# Patient Record
Sex: Female | Born: 1975
Health system: Southern US, Community
[De-identification: ages and names within clinical notes are randomized; demographics above are authoritative.]

## PROBLEM LIST (undated history)

## (undated) DIAGNOSIS — Z794 Long term (current) use of insulin: Secondary | ICD-10-CM

## (undated) DIAGNOSIS — E119 Type 2 diabetes mellitus without complications: Secondary | ICD-10-CM

## (undated) DIAGNOSIS — N200 Calculus of kidney: Secondary | ICD-10-CM

## (undated) DIAGNOSIS — D649 Anemia, unspecified: Secondary | ICD-10-CM

## (undated) DIAGNOSIS — R87610 Atypical squamous cells of undetermined significance on cytologic smear of cervix (ASC-US): Secondary | ICD-10-CM

## (undated) DIAGNOSIS — N809 Endometriosis, unspecified: Secondary | ICD-10-CM

## (undated) DIAGNOSIS — Z9889 Other specified postprocedural states: Secondary | ICD-10-CM

## (undated) HISTORY — DX: Long term (current) use of insulin: Z79.4

## (undated) HISTORY — PX: NASAL SINUS SURGERY: SHX719

## (undated) HISTORY — DX: Calculus of kidney: N20.0

## (undated) HISTORY — PX: BREAST ENHANCEMENT SURGERY: SHX7

## (undated) HISTORY — PX: APPENDECTOMY: SHX54

## (undated) HISTORY — PX: OVARIAN CYST REMOVAL: SHX89

## (undated) HISTORY — PX: OTHER SURGICAL HISTORY: SHX169

## (undated) HISTORY — DX: Atypical squamous cells of undetermined significance on cytologic smear of cervix (ASC-US): R87.610

## (undated) HISTORY — DX: Type 2 diabetes mellitus without complications: E11.9

## (undated) HISTORY — DX: Endometriosis, unspecified: N80.9

## (undated) HISTORY — PX: ABDOMINAL HYSTERECTOMY: SHX81

## (undated) HISTORY — PX: TUBAL LIGATION: SHX77

## (undated) HISTORY — PX: EYE SURGERY: SHX253

## (undated) HISTORY — PX: FOOT SURGERY: SHX648

---

## 1998-11-02 ENCOUNTER — Emergency Department (HOSPITAL_COMMUNITY): Admission: EM | Admit: 1998-11-02 | Discharge: 1998-11-02 | Payer: Self-pay | Admitting: Emergency Medicine

## 1998-11-02 ENCOUNTER — Encounter: Payer: Self-pay | Admitting: Emergency Medicine

## 1998-11-11 ENCOUNTER — Other Ambulatory Visit: Admission: RE | Admit: 1998-11-11 | Discharge: 1998-11-11 | Payer: Self-pay | Admitting: *Deleted

## 2000-01-06 ENCOUNTER — Other Ambulatory Visit: Admission: RE | Admit: 2000-01-06 | Discharge: 2000-01-06 | Payer: Self-pay | Admitting: Obstetrics & Gynecology

## 2000-08-07 ENCOUNTER — Inpatient Hospital Stay (HOSPITAL_COMMUNITY): Admission: AD | Admit: 2000-08-07 | Discharge: 2000-08-07 | Payer: Self-pay | Admitting: *Deleted

## 2000-08-25 ENCOUNTER — Inpatient Hospital Stay (HOSPITAL_COMMUNITY): Admission: AD | Admit: 2000-08-25 | Discharge: 2000-08-25 | Payer: Self-pay | Admitting: Obstetrics and Gynecology

## 2000-08-26 ENCOUNTER — Inpatient Hospital Stay (HOSPITAL_COMMUNITY): Admission: AD | Admit: 2000-08-26 | Discharge: 2000-08-28 | Payer: Self-pay | Admitting: Obstetrics and Gynecology

## 2000-11-29 ENCOUNTER — Other Ambulatory Visit: Admission: RE | Admit: 2000-11-29 | Discharge: 2000-11-29 | Payer: Self-pay | Admitting: Obstetrics and Gynecology

## 2001-12-06 ENCOUNTER — Other Ambulatory Visit: Admission: RE | Admit: 2001-12-06 | Discharge: 2001-12-06 | Payer: Self-pay | Admitting: Obstetrics and Gynecology

## 2002-10-16 ENCOUNTER — Other Ambulatory Visit: Admission: RE | Admit: 2002-10-16 | Discharge: 2002-10-16 | Payer: Self-pay | Admitting: Obstetrics and Gynecology

## 2007-10-07 ENCOUNTER — Emergency Department (HOSPITAL_COMMUNITY): Admission: EM | Admit: 2007-10-07 | Discharge: 2007-10-07 | Payer: Self-pay | Admitting: Emergency Medicine

## 2008-04-14 ENCOUNTER — Ambulatory Visit (HOSPITAL_COMMUNITY): Admission: RE | Admit: 2008-04-14 | Discharge: 2008-04-14 | Payer: Self-pay | Admitting: Family Medicine

## 2008-04-14 ENCOUNTER — Encounter (INDEPENDENT_AMBULATORY_CARE_PROVIDER_SITE_OTHER): Payer: Self-pay | Admitting: Family Medicine

## 2009-04-28 ENCOUNTER — Ambulatory Visit: Payer: Self-pay | Admitting: Family Medicine

## 2009-04-28 DIAGNOSIS — Z87442 Personal history of urinary calculi: Secondary | ICD-10-CM | POA: Insufficient documentation

## 2009-04-28 DIAGNOSIS — R319 Hematuria, unspecified: Secondary | ICD-10-CM | POA: Insufficient documentation

## 2009-04-28 LAB — CONVERTED CEMR LAB
Bilirubin Urine: NEGATIVE
Glucose, Urine, Semiquant: NEGATIVE
Ketones, urine, test strip: NEGATIVE
Nitrite: NEGATIVE
Protein, U semiquant: NEGATIVE
Specific Gravity, Urine: 1.01
Urobilinogen, UA: 0.2
pH: 6.5

## 2009-05-01 ENCOUNTER — Encounter: Payer: Self-pay | Admitting: Internal Medicine

## 2009-07-27 ENCOUNTER — Ambulatory Visit: Payer: Self-pay | Admitting: Occupational Medicine

## 2009-07-27 ENCOUNTER — Encounter (INDEPENDENT_AMBULATORY_CARE_PROVIDER_SITE_OTHER): Payer: Self-pay | Admitting: *Deleted

## 2010-07-12 NOTE — Letter (Signed)
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  Please take this into consideration when reviewing the time away from work/school.    Special Instructions:  [  ] None  [ x ] To be off the remainder of today, returning to the normal work / school schedule tomorrow.  [  ] To be off until the next scheduled appointment on ______________________.  [  ] Other ________________________________________________________________ ________________________________________________________________________   Sincerely yours,   Shelbie Proctor

## 2010-07-12 NOTE — Assessment & Plan Note (Signed)
Summary: FEVER,COUGH,RUNNY NOSE/-yellowish x 2 dys rm 1   Vital Signs:  Patient Profile:   35 Years Old Female CC:      Cold & URI symptoms Height:     64 inches Weight:      152 pounds O2 Sat:      100 % O2 treatment:    Room Air Temp:     99.0 degrees F oral Pulse rate:   74 / minute Pulse rhythm:   regular Resp:     16 per minute BP sitting:   115 / 76  (right arm) Cuff size:   regular  Vitals Entered By: Areta Haber CMA (July 27, 2009 5:12 PM)                  Current Allergies: ! BIAXIN   History of Present Illness Chief Complaint: Cold & URI symptoms History of Present Illness: Presents with complaints of sinus congestion, and yellow nasal drainage.  No reports of fever.   Headache as well.   No reports of cough.  No diarrhea.   No sore throat or ear pain.   Symptoms present 3 days.   History of sinus surgery in 2009 for chronic sinusitis.   Doing well since then.   Current Problems: UPPER RESPIRATORY INFECTION, ACUTE (ICD-465.9) RENAL CALCULUS, HX OF (ICD-V13.01) HEMATURIA UNSPECIFIED (ICD-599.70)   Current Meds MUCINEX D 60-600 MG XR12H-TAB (PSEUDOEPHEDRINE-GUAIFENESIN) As directed ADVIL 200 MG TABS (IBUPROFEN) as directed AZITHROMYCIN 250 MG  TABS (AZITHROMYCIN) 2 by  mouth today and then 1 daily for 4 days  REVIEW OF SYSTEMS Constitutional Symptoms       Complains of fever.     Denies chills, night sweats, weight loss, weight gain, and fatigue.  Eyes       Denies change in vision, eye pain, eye discharge, glasses, contact lenses, and eye surgery. Ear/Nose/Throat/Mouth       Complains of frequent runny nose and sinus problems.      Denies hearing loss/aids, change in hearing, ear pain, ear discharge, dizziness, frequent nose bleeds, sore throat, hoarseness, and tooth pain or bleeding.      Comments: yellowish x 2 dys Respiratory       Complains of dry cough.      Denies productive cough, wheezing, shortness of breath, asthma, bronchitis, and  emphysema/COPD.  Cardiovascular       Denies murmurs, chest pain, and tires easily with exhertion.    Gastrointestinal       Complains of stomach pain and diarrhea.      Denies nausea/vomiting, constipation, blood in bowel movements, and indigestion.      Comments: x today Genitourniary       Denies painful urination, kidney stones, and loss of urinary control. Neurological       Complains of headaches.      Denies paralysis, seizures, and fainting/blackouts. Musculoskeletal       Denies muscle pain, joint pain, joint stiffness, decreased range of motion, redness, swelling, muscle weakness, and gout.  Skin       Denies bruising, unusual mles/lumps or sores, and hair/skin or nail changes.  Psych       Denies mood changes, temper/anger issues, anxiety/stress, speech problems, depression, and sleep problems. Other Comments: Pt has not been seen by PCP   Past History:  Past Medical History: Last updated: 04/28/2009 Unremarkable  Past Surgical History: Last updated: 04/28/2009 Appendectomy Tubal ligation Foot Surgery Sinus Surgery Ovarian cyst  Family History: Last updated: 04/28/2009 Pancreatic Cancer  Family Hsitory Headaches  Social History: Last updated: 04/28/2009 Married Never Smoked Alcohol use-no Drug use-no Regular exercise-yes  Risk Factors: Exercise: yes (04/28/2009)  Risk Factors: Smoking Status: never (04/28/2009) Physical Exam General appearance: well developed, well nourished, no acute distress Pupils: equal, round, reactive to light Ears: normal, no lesions or deformities Nasal: marked sinus and nasal congestion Oral/Pharynx: tongue normal, posterior pharynx without erythema or exudate Chest/Lungs: no rales, wheezes, or rhonchi bilateral, breath sounds equal without effort Heart: regular rate and  rhythm, no murmur Assessment New Problems: UPPER RESPIRATORY INFECTION, ACUTE (ICD-465.9)   Plan New Medications/Changes: AZITHROMYCIN 250 MG   TABS (AZITHROMYCIN) 2 by  mouth today and then 1 daily for 4 days  #6 x 0, 07/27/2009, Kathrine Haddock MD  New Orders: Est. Patient Level II [id number] Planning Comments:   2.Over the counter cough and cold prepartaions such as (mucinex or Mucinex-DM) may be used to treat your cold symptoms.  Afrin nasal spray (2 sprays each nostril 2 times a day for three days) may be used to relieve sinus congestion.  Do not get your z-pack filled until Friday.   Only get this filled if your symptoms worsen or you are no better by Friday.     The patient and/or caregiver has been counseled thoroughly with regard to medications prescribed including dosage, schedule, interactions, rationale for use, and possible side effects and they verbalize understanding.  Diagnoses and expected course of recovery discussed and will return if not improved as expected or if the condition worsens. Patient and/or caregiver verbalized understanding.  Prescriptions: AZITHROMYCIN 250 MG  TABS (AZITHROMYCIN) 2 by  mouth today and then 1 daily for 4 days  #6 x 0   Entered and Authorized by:   Kathrine Haddock MD   Signed by:   Kathrine Haddock MD on 07/27/2009   Method used:   Print then Give to Patient   RxID:   [id number]

## 2013-01-07 ENCOUNTER — Emergency Department
Admission: EM | Admit: 2013-01-07 | Discharge: 2013-01-07 | Disposition: A | Discharge status: Home / Self Care | Payer: Self-pay | Source: Home / Self Care | Attending: Family Medicine | Admitting: Family Medicine

## 2013-01-07 ENCOUNTER — Encounter: Payer: Self-pay | Admitting: *Deleted

## 2013-01-07 DIAGNOSIS — M2669 Other specified disorders of temporomandibular joint: Secondary | ICD-10-CM

## 2013-01-07 DIAGNOSIS — M26629 Arthralgia of temporomandibular joint, unspecified side: Secondary | ICD-10-CM

## 2013-01-07 MED ORDER — CYCLOBENZAPRINE HCL 10 MG PO TABS: ORAL_TABLET | ORAL | Status: DC

## 2013-01-07 MED ORDER — MELOXICAM 15 MG PO TABS: 15.0000 mg | ORAL_TABLET | Freq: Every day | ORAL | Status: DC

## 2013-01-07 NOTE — ED Notes (Signed)
Pt c/o RT ear pain x 1wk. Denies fever. She has taken Mucinex and IBF.

## 2013-01-07 NOTE — ED Provider Notes (Signed)
CSN: [id number]     Arrival date & time 01/07/13  1828 History     First MD Initiated Contact with Patient 01/07/13 1843     Chief Complaint  Patient presents with  . Otalgia     HPI Comments: Patient complains of pain in her right ear for one week.  No recent URI symptoms.  She feels that her hearing is slightly decreased.  She has a history of allergies and notes some post-nasal drainage.  She has had occasional night sweats recently.  No discharge from ear.  No improvement with Mucinex D. She admits that she has had problem grinding her teeth in past and has night guards.  The history is provided by the patient.    History reviewed. No pertinent past medical history. Past Surgical History  Procedure Laterality Date  . Foot surgery    . Nasal sinus surgery    . Eye surgery    . Breast enhancement surgery    . Abdominal hysterectomy    . Appendectomy     Family History  Problem Relation Age of Onset  . Cancer Mother     lung/ brain tumor removed  . Cancer Father     pancreatic   History  Substance Use Topics  . Smoking status: Never Smoker   . Smokeless tobacco: Not on file  . Alcohol Use: Yes   OB History   Grav Para Term Preterm Abortions TAB SAB Ect Mult Living                 Review of Systems No sore throat No cough No pleuritic pain No wheezing No nasal congestion + post-nasal drainage No sinus pain/pressure No itchy/red eyes + earache No hemoptysis No SOB No fever/chills.  Has had night sweats No nausea No vomiting No abdominal pain No diarrhea No urinary symptoms No skin rashes No fatigue No myalgias + headache Used OTC meds without relief  Allergies  Clarithromycin and Morphine and related  Home Medications   Current Outpatient Rx  Name  Route  Sig  Dispense  Refill  . cyclobenzaprine (FLEXERIL) 10 MG tablet      Take one tab by mouth at bedtime   15 tablet   0   . meloxicam (MOBIC) 15 MG tablet   Oral   Take 1 tablet (15 mg  total) by mouth daily. Take with food each evening   15 tablet   0    BP 112/71  Pulse 71  Temp(Src) 98.4 F (36.9 C) (Oral)  Resp 16  Ht 5\' 4"  (1.626 m)  Wt 146 lb (66.225 kg)  BMI 25.05 kg/m2  SpO2 100% Physical Exam Nursing notes and Vital Signs reviewed. Appearance:  Patient appears healthy, stated age, and in no acute distress Eyes:  Pupils are equal, round, and reactive to light and accomodation.  Extraocular movement is intact.  Conjunctivae are not inflamed  Ears:  Canals normal.  Tympanic membranes normal. There is distinct tenderness over the right temporomandibular joint.  Palpation there recreates her pain.   Nose:  Mildly congested turbinates.  No sinus tenderness.    Pharynx:  Normal Neck:  Supple.  No adenopathy Lungs:  Clear to auscultation.  Breath sounds are equal.  Heart:  Regular rate and rhythm without murmurs, rubs, or gallops.   Skin:  No rash present.   ED Course   Procedures  none  Labs Reviewed - Tympanogram normal both ears   1. TMJ pain dysfunction syndrome  MDM  Begin Mobic and Flexeril Continue night guard. Apply ice pack once or twice daily. Follwoup with ENT if not improving two weeks.  Lattie Haw, MD 01/13/13 [phone]

## 2013-07-28 ENCOUNTER — Encounter: Payer: Self-pay | Admitting: Emergency Medicine

## 2013-07-28 ENCOUNTER — Emergency Department
Admission: EM | Admit: 2013-07-28 | Discharge: 2013-07-28 | Disposition: A | Discharge status: Home / Self Care | Payer: BC Managed Care – PPO | Source: Home / Self Care | Attending: Emergency Medicine | Admitting: Emergency Medicine

## 2013-07-28 DIAGNOSIS — J0100 Acute maxillary sinusitis, unspecified: Secondary | ICD-10-CM

## 2013-07-28 MED ORDER — CEFDINIR 300 MG PO CAPS: 300.0000 mg | ORAL_CAPSULE | Freq: Two times a day (BID) | ORAL | Status: DC

## 2013-07-28 MED ORDER — CEFTRIAXONE SODIUM 1 G IJ SOLR
1.0000 g | INTRAMUSCULAR | Status: AC
  Administered 2013-07-28: 1 g via INTRAMUSCULAR

## 2013-07-28 MED ORDER — FLUTICASONE PROPIONATE 50 MCG/ACT NA SUSP: NASAL | Status: DC

## 2013-07-28 NOTE — ED Notes (Signed)
Ellen Morales c/o sinus pain and congestion with green mucous x 2 weeks. No fever.

## 2013-07-28 NOTE — ED Provider Notes (Signed)
CSN: [id number]     Arrival date & time 07/28/13  1312 History   First MD Initiated Contact with Patient 07/28/13 1317     Chief Complaint  Patient presents with  . Sinus Problem     (Consider location/radiation/quality/duration/timing/severity/associated sxs/prior Treatment) HPI URI HISTORY  Ellen Morales is a 38 y.o. female who complains of onset of cold/sinus symptoms for 2 weeks.  Have been using over-the-counter treatment, such as Mucinex D and ibuprofen, which helps a little bit. In the past she's had recurrent sinus infections. However this is her first sinus infection since her sinus surgery 3 years ago.  No chills/sweats +  Fever  +  Nasal congestion +  Discolored Post-nasal drainage No bilateral maxillary sinus pain/pressure, which is severe, and she can feel it in her teeth No sore throat  No  cough No wheezing No chest congestion No hemoptysis No shortness of breath No pleuritic pain  No itchy/red eyes Mild, right earache  No nausea No vomiting No abdominal pain No diarrhea  No skin rashes +  Fatigue No myalgias No headache  History reviewed. No pertinent past medical history. Past Surgical History  Procedure Laterality Date  . Foot surgery    . Nasal sinus surgery    . Eye surgery    . Breast enhancement surgery    . Abdominal hysterectomy    . Appendectomy     Family History  Problem Relation Age of Onset  . Cancer Mother     lung/ brain tumor removed  . Diabetes Mother   . Cancer Father     pancreatic   History  Substance Use Topics  . Smoking status: Never Smoker   . Smokeless tobacco: Never Used  . Alcohol Use: Yes   OB History   Grav Para Term Preterm Abortions TAB SAB Ect Mult Living                 Review of Systems  All other systems reviewed and are negative.      Allergies  Clarithromycin and Morphine and related  Home Medications   Current Outpatient Rx  Name  Route  Sig  Dispense  Refill  . cefdinir (OMNICEF) 300  MG capsule   Oral   Take 1 capsule (300 mg total) by mouth 2 (two) times daily. X 14 days   28 capsule   0   . fluticasone (FLONASE) 50 MCG/ACT nasal spray      1 or 2 sprays each nostril twice a day   16 g   0    BP 115/78  Pulse 76  Temp(Src) 98.1 F (36.7 C) (Oral)  Resp 16  Wt 148 lb (67.132 kg)  SpO2 100% Physical Exam  Nursing note and vitals reviewed. Constitutional: She is oriented to person, place, and time. She appears well-developed and well-nourished. No distress.  HENT:  Head: Normocephalic and atraumatic.  Right Ear: Tympanic membrane, external ear and ear canal normal.  Left Ear: Tympanic membrane, external ear and ear canal normal.  Nose: Mucosal edema and rhinorrhea present. Right sinus exhibits maxillary sinus tenderness. Left sinus exhibits maxillary sinus tenderness.  Mouth/Throat: Oropharynx is clear and moist. No oral lesions. No oropharyngeal exudate.  Eyes: Right eye exhibits no discharge. Left eye exhibits no discharge. No scleral icterus.  Neck: Neck supple.  Cardiovascular: Normal rate, regular rhythm and normal heart sounds.   Pulmonary/Chest: Effort normal and breath sounds normal. She has no wheezes. She has no rales.  Lymphadenopathy:  She has no cervical adenopathy.  Neurological: She is alert and oriented to person, place, and time.  Skin: Skin is warm and dry.    ED Course  Procedures (including critical care time) Labs Review Labs Reviewed - No data to display Imaging Review No results found.    MDM   Final diagnoses:  Acute maxillary sinusitis    Treatment options discussed. At her request to start treatment with injection of antibiotic, Rocephin 1 g IM stat given.--Given her complex history of recurrent sinusitis, and the severity of her current symptoms, I'm agreeable that this is clinically indicated. Omnicef 300 mg by mouth twice a day x14 days Flonase Continue Mucinex D and ibuprofen Other symptomatic care  discussed Precautions discussed. Red flags discussed. Questions invited and answered. Patient voiced understanding and agreement.      Lajean Manesavid Massey, MD 07/28/13 [phone]

## 2013-09-10 DIAGNOSIS — R87610 Atypical squamous cells of undetermined significance on cytologic smear of cervix (ASC-US): Secondary | ICD-10-CM

## 2013-09-10 HISTORY — DX: Atypical squamous cells of undetermined significance on cytologic smear of cervix (ASC-US): R87.610

## 2013-11-20 ENCOUNTER — Emergency Department
Admission: EM | Admit: 2013-11-20 | Discharge: 2013-11-20 | Disposition: A | Discharge status: Home / Self Care | Payer: BC Managed Care – PPO | Source: Home / Self Care | Attending: Emergency Medicine | Admitting: Emergency Medicine

## 2013-11-20 DIAGNOSIS — R300 Dysuria: Secondary | ICD-10-CM

## 2013-11-20 DIAGNOSIS — N390 Urinary tract infection, site not specified: Secondary | ICD-10-CM

## 2013-11-20 LAB — POCT URINALYSIS DIP (MANUAL ENTRY)
Bilirubin, UA: NEGATIVE
Blood, UA: NEGATIVE
Glucose, UA: NEGATIVE
Ketones, POC UA: NEGATIVE
Leukocytes, UA: NEGATIVE
Nitrite, UA: POSITIVE
Protein Ur, POC: NEGATIVE
Spec Grav, UA: >=1.03 (ref 1.005–1.03)
Urobilinogen, UA: 0.2 (ref 0–1)
pH, UA: 5 (ref 5–8)

## 2013-11-20 MED ORDER — CIPROFLOXACIN HCL 500 MG PO TABS
500.0000 mg | ORAL_TABLET | Freq: Two times a day (BID) | ORAL | Status: DC
Start: 2013-11-20 — End: 2014-08-28

## 2013-11-20 NOTE — ED Provider Notes (Signed)
CSN: [id number]     Arrival date & time 11/20/13  1309 History   First MD Initiated Contact with Patient 11/20/13 1315     Chief Complaint  Patient presents with  . Dysuria   (Consider location/radiation/quality/duration/timing/severity/associated sxs/prior Treatment) HPI This is a 38 y.o. female who presents today with UTI symptoms for 1 day.  + dysuria + frequency + urgency No hematuria No vaginal discharge No fever/chills + Minimal suprapubic abdominal pain Minimal nausea this morning, resolved now  No vomiting Mild low back pain No fatigue She denies chance of pregnancy.--Status post hysterectomy Has tried over-the-counter measures without improvement.   History of kidney stone 2014, but the pain is not similar to what she had then. History of UTIs in the past with otherwise negative urology workup. Denies UTI in the past 12 months. No past medical history on file. Past Surgical History  Procedure Laterality Date  . Foot surgery    . Nasal sinus surgery    . Eye surgery    . Breast enhancement surgery    . Abdominal hysterectomy    . Appendectomy     Family History  Problem Relation Age of Onset  . Cancer Mother     lung/ brain tumor removed  . Diabetes Mother   . Cancer Father     pancreatic   History  Substance Use Topics  . Smoking status: Never Smoker   . Smokeless tobacco: Never Used  . Alcohol Use: Yes   OB History   Grav Para Term Preterm Abortions TAB SAB Ect Mult Living                 Review of Systems  All other systems reviewed and are negative.   Allergies  Clarithromycin and Morphine and related  Home Medications   Prior to Admission medications   Medication Sig Start Date End Date Taking? Authorizing Provider  fexofenadine (ALLEGRA) 180 MG tablet Take 180 mg by mouth daily.   Yes Historical Provider, MD  ibuprofen (ADVIL,MOTRIN) 800 MG tablet Take 800 mg by mouth every 8 (eight) hours as needed.   Yes Historical Provider, MD   pseudoephedrine-guaifenesin (MUCINEX D) 60-600 MG per tablet Take 1 tablet by mouth every 12 (twelve) hours.   Yes Historical Provider, MD  cefdinir (OMNICEF) 300 MG capsule Take 1 capsule (300 mg total) by mouth 2 (two) times daily. X 14 days 07/28/13   Lajean Manes, MD  ciprofloxacin (CIPRO) 500 MG tablet Take 1 tablet (500 mg total) by mouth 2 (two) times daily. Take for 10 days. 11/20/13   Lajean Manes, MD  fluticasone [facility]) 50 MCG/ACT nasal spray 1 or 2 sprays each nostril twice a day 07/28/13   Lajean Manes, MD   BP 117/69  Pulse 76  Temp(Src) 98.4 F (36.9 C) (Oral)  Ht 5\' 4"  (1.626 m)  Wt 156 lb (70.761 kg)  BMI 26.76 kg/m2  SpO2 99% Physical Exam  Nursing note and vitals reviewed. Constitutional: She is oriented to person, place, and time. She appears well-developed and well-nourished. No distress.  HENT:  Mouth/Throat: Oropharynx is clear and moist.  Eyes: No scleral icterus.  Neck: Neck supple.  Cardiovascular: Normal rate, regular rhythm and normal heart sounds.   Pulmonary/Chest: Breath sounds normal.  Abdominal: Soft. She exhibits no mass. There is no hepatosplenomegaly. There is tenderness in the suprapubic area. There is no rebound, no guarding and no CVA tenderness.  Lymphadenopathy:    She has no cervical adenopathy.  Neurological: She is  alert and oriented to person, place, and time.  Skin: Skin is warm and dry.    ED Course  Procedures (including critical care time) Labs Review Labs Reviewed  URINE CULTURE  POCT URINALYSIS DIP (MANUAL ENTRY)   Results for orders placed during the hospital encounter of 11/20/13  POCT URINALYSIS DIP (MANUAL ENTRY)      Result Value Ref Range   Color, UA orange     Clarity, UA clear     Glucose, UA neg     Bilirubin, UA negative     Bilirubin, UA negative     Spec Grav, UA >=1.030  1.005 - 1.03   Blood, UA negative     pH, UA 5.0  5 - 8   Protein Ur, POC negative     Urobilinogen, UA 0.2  0 - 1   Nitrite, UA  Positive     Leukocytes, UA Negative      Imaging Review No results found.   MDM   1. UTI (urinary tract infection)   2. Dysuria    Urinalysis positive for nitrate, but negative for blood. Clinically, has uncomplicated cystitis Without evidence of pyelonephritis or kidney stone.  Treatment options discussed. Cipro 500 twice a day x10 days Other symptomatic care discussed Send off urine culture Followup with PCP or urologist if not improving in 3-4 days, sooner if worse or if any red flags . She voiced understanding and agreement.     Lajean Manesavid Massey, MD 11/20/13 1440

## 2013-11-20 NOTE — ED Notes (Signed)
LBP, dysuria started yesterday

## 2013-11-22 ENCOUNTER — Telehealth: Payer: Self-pay | Admitting: *Deleted

## 2013-11-22 LAB — URINE CULTURE
Colony Count: NO GROWTH
Organism ID, Bacteria: NO GROWTH

## 2014-08-28 ENCOUNTER — Emergency Department
Admission: EM | Admit: 2014-08-28 | Discharge: 2014-08-28 | Disposition: A | Discharge status: Home / Self Care | Payer: BLUE CROSS/BLUE SHIELD | Source: Home / Self Care | Attending: Emergency Medicine | Admitting: Emergency Medicine

## 2014-08-28 ENCOUNTER — Encounter: Payer: Self-pay | Admitting: Emergency Medicine

## 2014-08-28 DIAGNOSIS — J209 Acute bronchitis, unspecified: Secondary | ICD-10-CM | POA: Diagnosis not present

## 2014-08-28 MED ORDER — BENZONATATE 200 MG PO CAPS: ORAL_CAPSULE | ORAL | Status: DC

## 2014-08-28 MED ORDER — AZITHROMYCIN 250 MG PO TABS: ORAL_TABLET | ORAL | Status: DC

## 2014-08-28 MED ORDER — PREDNISONE (PAK) 10 MG PO TABS: ORAL_TABLET | ORAL | Status: DC

## 2014-08-28 MED ORDER — PROMETHAZINE-CODEINE 6.25-10 MG/5ML PO SYRP: ORAL_SOLUTION | ORAL | Status: DC

## 2014-08-28 MED ORDER — METHYLPREDNISOLONE ACETATE 80 MG/ML IJ SUSP
80.0000 mg | Freq: Once | INTRAMUSCULAR | Status: AC
  Administered 2014-08-28: 80 mg via INTRAMUSCULAR

## 2014-08-28 NOTE — ED Notes (Signed)
Cough, hoarseness, congestion x 10 days

## 2014-08-28 NOTE — ED Provider Notes (Signed)
CSN: [id number]     Arrival date & time 08/28/14  0915 History   First MD Initiated Contact with Patient 08/28/14 [phone]     Chief Complaint  Patient presents with  . Cough    HPI URI HISTORY  Kayzlee is a 39 y.o. female who complains of onset of respiratory, chest congestion, cold symptoms for 10 days.--Progressively worsening.   She is under the care of Dr. Lovett Calender at [facility]. Started with sinus infection with discolored rhinorrhea about 14 days ago, completed course of Ceftin and prednisone Dosepak. Sinus symptoms improved, but for the past 10 days, progressively worsening chest congestion, hacking cough that keeps her up at night, irritating burning cough throughout her chest when coughing, and now cough is productive of green sputum the past 2 days. No definite shortness of breath. No exertional chest pain. Tried Delsym OTC but that didn't help the cough. She has chronic seasonal allergies, getting allergy shots. Denies history of asthma or chronic lung disease. No history of pneumonia. Reviewed her drug allergies. She has had GI side effects on Biaxin in the past, but has taken Zithromax in the past without problems.  No chills/sweats +  Low-grade Fever  + Minimal Nasal congestion No Discolored Post-nasal drainage No sinus pain/pressure Minimal sore throat  +  Packing, productive cough No wheezing Positive chest congestion No hemoptysis No shortness of breath No pleuritic pain  No itchy/red eyes No earache  No nausea No vomiting No abdominal pain No diarrhea  No skin rashes +  Fatigue No myalgias No headache   History reviewed. No pertinent past medical history. Past Surgical History  Procedure Laterality Date  . Foot surgery    . Nasal sinus surgery    . Eye surgery    . Breast enhancement surgery    . Abdominal hysterectomy    . Appendectomy     Family History  Problem Relation Age of Onset  . Cancer Mother     lung/ brain tumor removed  . Diabetes  Mother   . Cancer Father     pancreatic   History  Substance Use Topics  . Smoking status: Never Smoker   . Smokeless tobacco: Never Used  . Alcohol Use: Yes   OB History    No data available     Review of Systems  All other systems reviewed and are negative.   Allergies  Clarithromycin and Morphine and related  Home Medications   Prior to Admission medications   Medication Sig Start Date End Date Taking? Authorizing Provider  cetirizine (ZYRTEC) 10 MG tablet Take 10 mg by mouth daily.   Yes Historical Provider, MD  azithromycin (ZITHROMAX Z-PAK) 250 MG tablet Take 2 tablets on day one, then 1 tablet daily on days 2 through 5 08/28/14   Lajean Manes, MD  benzonatate (TESSALON) 200 MG capsule Take 1 every 8 hours as needed for cough. 08/28/14   Lajean Manes, MD  fexofenadine (ALLEGRA) 180 MG tablet Take 180 mg by mouth daily.    Historical Provider, MD  fluticasone Aleda Grana) 50 MCG/ACT nasal spray 1 or 2 sprays each nostril twice a day 07/28/13   Lajean Manes, MD  ibuprofen (ADVIL,MOTRIN) 800 MG tablet Take 800 mg by mouth every 8 (eight) hours as needed.    Historical Provider, MD  predniSONE (STERAPRED UNI-PAK) 10 MG tablet Starting Saturday 3/19, Take as directed for 6 days.--Take 6 on day 1, 5 on day 2, 4 on day 3, then 3 tablets on day 4,  then 2 tablets on day 5, then 1 on day 6. 08/28/14   Lajean Manesavid Massey, MD  promethazine-codeine [facility](PHENERGAN WITH CODEINE) 6.25-10 MG/5ML syrup Take 1-2 teaspoons every 4 hrs at night as needed for cough. May cause drowsiness. 08/28/14   Lajean Manesavid Massey, MD  pseudoephedrine-guaifenesin [facility](MUCINEX D) 60-600 MG per tablet Take 1 tablet by mouth every 12 (twelve) hours.    Historical Provider, MD   BP 118/73 mmHg  Pulse 64  Temp(Src) 98.2 F (36.8 C) (Oral)  Ht 5\' 4"  (1.626 m)  Wt 156 lb (70.761 kg)  BMI 26.76 kg/m2  SpO2 100% Physical Exam  Constitutional: She is oriented to person, place, and time. She appears well-developed and well-nourished. No  distress.  HENT:  Head: Normocephalic and atraumatic.  Right Ear: Tympanic membrane normal.  Left Ear: Tympanic membrane normal.  Nose: Nose normal.  Mouth/Throat: Oropharynx is clear and moist. No oropharyngeal exudate.  Eyes: Right eye exhibits no discharge. Left eye exhibits no discharge. No scleral icterus.  Neck: Neck supple.  Cardiovascular: Normal rate, regular rhythm and normal heart sounds.   Pulmonary/Chest: No respiratory distress. She has rhonchi. She has no rales.  No wheezes, except rare late expiratory wheeze anteriorly on forced expiration only.  Hacking cough noted  Lymphadenopathy:    She has no cervical adenopathy.  Neurological: She is alert and oriented to person, place, and time.  Skin: Skin is warm and dry. No rash noted.  Psychiatric: She has a normal mood and affect.  Nursing note and vitals reviewed.   ED Course  Procedures (including critical care time) Labs Review Labs Reviewed - No data to display  Imaging Review No results found.   MDM   1. Acute bronchitis, unspecified organism    likely from both infection and inflammatory components. No clinical evidence of pneumonia. She declined chest x-ray today.  Treatment options discussed, as well as risks, benefits, alternatives. Patient voiced understanding and agreement with the following plans: Depo-Medrol 80 mg IM. New Prescriptions   AZITHROMYCIN (ZITHROMAX Z-PAK) 250 MG TABLET    Take 2 tablets on day one, then 1 tablet daily on days 2 through 5   BENZONATATE (TESSALON) 200 MG CAPSULE    Take 1 every 8 hours as needed for cough.   PREDNISONE (STERAPRED UNI-PAK) 10 MG TABLET    Starting Saturday 3/19, Take as directed for 6 days.--Take 6 on day 1, 5 on day 2, 4 on day 3, then 3 tablets on day 4, then 2 tablets on day 5, then 1 on day 6.   PROMETHAZINE-CODEINE (PHENERGAN WITH CODEINE) 6.25-10 MG/5ML SYRUP    Take 1-2 teaspoons every 4 hrs at night as needed for cough. May cause drowsiness.   (note: History of intolerance to morphine after surgery in the past, but she has taken codeine in the past without side effects or problems) Follow-up with your ENT or PCP in 5-7 days if not improving, or sooner if symptoms become worse. Precautions discussed. Red flags discussed. Questions invited and answered. Patient voiced understanding and agreement.     Lajean Manesavid Massey, MD 08/28/14 1005

## 2014-09-16 ENCOUNTER — Encounter: Payer: Self-pay | Admitting: Emergency Medicine

## 2014-09-16 ENCOUNTER — Emergency Department (INDEPENDENT_AMBULATORY_CARE_PROVIDER_SITE_OTHER): Payer: BLUE CROSS/BLUE SHIELD

## 2014-09-16 ENCOUNTER — Emergency Department
Admission: EM | Admit: 2014-09-16 | Discharge: 2014-09-16 | Disposition: A | Discharge status: Home / Self Care | Payer: BLUE CROSS/BLUE SHIELD | Source: Home / Self Care | Attending: Family Medicine | Admitting: Family Medicine

## 2014-09-16 DIAGNOSIS — R079 Chest pain, unspecified: Secondary | ICD-10-CM | POA: Diagnosis not present

## 2014-09-16 DIAGNOSIS — R05 Cough: Secondary | ICD-10-CM

## 2014-09-16 DIAGNOSIS — K219 Gastro-esophageal reflux disease without esophagitis: Secondary | ICD-10-CM | POA: Diagnosis not present

## 2014-09-16 DIAGNOSIS — R053 Chronic cough: Secondary | ICD-10-CM

## 2014-09-16 MED ORDER — BENZONATATE 200 MG PO CAPS: ORAL_CAPSULE | ORAL | Status: DC

## 2014-09-16 MED ORDER — OMEPRAZOLE 40 MG PO CPDR: 40.0000 mg | DELAYED_RELEASE_CAPSULE | Freq: Every day | ORAL | Status: DC

## 2014-09-16 NOTE — ED Provider Notes (Signed)
CSN: [id number]     Arrival date & time 09/16/14  1755 History   First MD Initiated Contact with Patient 09/16/14 1804     Chief Complaint  Patient presents with  . Cough      HPI Comments: Patient developed a respiratory infection about one month ago, and after about 10 days she sought medical help.  She was started on a 10 day course of Augmentin and oral steroid.  She has a history of chronic seasonal rhinitis, followed by ENT Dr. Lovett CalenderSheely.  No history of asthma.  Her symptoms persisted after treatment with Augmentin, and she was then treated with a 10 day course of cefdinir.  Her cough persisted, and finally she was treated with a Z-pack and prednisone dosepak.  About 1.5 weeks ago she had a CT scan of her sinuses which was negative.   She reports that she feels well except for her cough, which is worse early in the morning.  Over the past several weeks she has developed increased tightness in her chest and indigestion/burning.  She has taken Tums with slight improvement, and then switched to Pepcid which was more helpful.  She states that she has had night sweats during the past month but no fever. She reports that she has a past history of minimal pericardial effusion of unknown etiology, and is followed with an echocardiogram about every 5 years.   The history is provided by the patient.    History reviewed. No pertinent past medical history. Past Surgical History  Procedure Laterality Date  . Foot surgery    . Nasal sinus surgery    . Eye surgery    . Breast enhancement surgery    . Abdominal hysterectomy    . Appendectomy     Family History  Problem Relation Age of Onset  . Cancer Mother     lung/ brain tumor removed  . Diabetes Mother   . Cancer Father     pancreatic   History  Substance Use Topics  . Smoking status: Never Smoker   . Smokeless tobacco: Never Used  . Alcohol Use: Yes   OB History    No data available     Review of Systems No sore throat + cough No  pleuritic pain, but has tightness in anterior chest + wheezing No nasal congestion No post-nasal drainage No sinus pain/pressure No itchy/red eyes No earache No hemoptysis + SOB No fever/chills No nausea No vomiting + abdominal pain and indigestion No diarrhea No urinary symptoms No skin rash No fatigue No myalgias No headache Used OTC meds without relief  Allergies  Clarithromycin and Morphine and related  Home Medications   Prior to Admission medications   Medication Sig Start Date End Date Taking? Authorizing Provider  azithromycin (ZITHROMAX Z-PAK) 250 MG tablet Take 2 tablets on day one, then 1 tablet daily on days 2 through 5 08/28/14   Lajean Manesavid Massey, MD  benzonatate (TESSALON) 200 MG capsule Take one tab by mouth at bedtime as needed for cough 09/16/14   Lattie HawStephen A Precious Segall, MD  cetirizine (ZYRTEC) 10 MG tablet Take 10 mg by mouth daily.    Historical Provider, MD  fexofenadine (ALLEGRA) 180 MG tablet Take 180 mg by mouth daily.    Historical Provider, MD  fluticasone Aleda Grana(FLONASE) 50 MCG/ACT nasal spray 1 or 2 sprays each nostril twice a day 07/28/13   Lajean Manesavid Massey, MD  ibuprofen (ADVIL,MOTRIN) 800 MG tablet Take 800 mg by mouth every 8 (eight) hours as needed.  Historical Provider, MD  omeprazole (PRILOSEC) 40 MG capsule Take 1 capsule (40 mg total) by mouth daily. Take 30 minutes before a meal. 09/16/14   Lattie Haw, MD  predniSONE (STERAPRED UNI-PAK) 10 MG tablet Starting Saturday 3/19, Take as directed for 6 days.--Take 6 on day 1, 5 on day 2, 4 on day 3, then 3 tablets on day 4, then 2 tablets on day 5, then 1 on day 6. 08/28/14   Lajean Manes, MD  promethazine-codeine [facility] WITH CODEINE) 6.25-10 MG/5ML syrup Take 1-2 teaspoons every 4 hrs at night as needed for cough. May cause drowsiness. 08/28/14   Lajean Manes, MD  pseudoephedrine-guaifenesin [facility] D) 60-600 MG per tablet Take 1 tablet by mouth every 12 (twelve) hours.    Historical Provider, MD   BP 118/80 mmHg   Pulse 75  Temp(Src) 98.2 F (36.8 C) (Oral)  Resp 16  Ht  (1.626 m)  Wt 158 lb (71.668 kg)  BMI 27.11 kg/m2  SpO2 100% Physical Exam Nursing notes and Vital Signs reviewed. Appearance:  Patient appears stated age, and in no acute distress Eyes:  Pupils are equal, round, and reactive to light and accomodation.  Extraocular movement is intact.  Conjunctivae are not inflamed  Ears:  Canals normal.  Tympanic membranes normal.  Nose:  Mildly congested turbinates.  No sinus tenderness.  Pharynx:  Normal Neck:  Supple.  No adenopathy Lungs:  Clear to auscultation.  Breath sounds are equal.  No tenderness over sternum. Heart:  Regular rate and rhythm without murmurs, rubs, or gallops.  Abdomen:  Mild sub-xiphoid tenderness without masses or hepatosplenomegaly.  Bowel sounds are present.  No CVA or flank tenderness.  Extremities:  No edema.  No calf tenderness Skin:  No rash present.   ED Course  Procedures none  Labs Reviewed -  POCT CBC:  WBC 9.5; LY 36.6; MO 5.5; GR 57.9; Hgb 12.9; Platelets 271  EKG: Rate:  72 BPM PR:  150 msec QT:  380 msec QTcH:  401 msec QRSD:  94 msec QRS axis:  46 degrees Interpretation:  Normal sinus rhythm; within normal limits.  No suggestion of pericardial effusion     Imaging Review Dg Chest 2 View  09/16/2014   CLINICAL DATA:  Cough, congestion, and chest pain.  EXAM: CHEST  2 VIEW  COMPARISON:  None.  FINDINGS: The heart size and mediastinal contours are within normal limits. Both lungs are clear. The visualized skeletal structures are unremarkable.  IMPRESSION: No active cardiopulmonary disease.   Electronically Signed   By: Gaylyn Rong M.D.   On: 09/16/2014 18:36     MDM   1. Persistent cough, probable exacerbated by development of GERD during her recent URI.  No evidence recurrent pericardial effusion.   2. Gastroesophageal reflux disease, esophagitis presence not specified    Begin Omeprazole  each evening Try Tessalon   at bedtime (if not effective may call for Tussionex) Try benzonatate  at bedtime for night cough (call if not helpful for different medication). Reflux precautions If cough persists at two weeks recommend pulmonary evaluation    Lattie Haw, MD 09/18/14 [phone]

## 2014-09-16 NOTE — Discharge Instructions (Signed)
Try benzonatate 200mg  at bedtime for night cough (call if not helpful for different medication).   Gastroesophageal Reflux Disease, Adult Gastroesophageal reflux disease (GERD) happens when acid from your stomach flows up into the esophagus. When acid comes in contact with the esophagus, the acid causes soreness (inflammation) in the esophagus. Over time, GERD may create small holes (ulcers) in the lining of the esophagus. CAUSES   Increased body weight. This puts pressure on the stomach, making acid rise from the stomach into the esophagus.  Smoking. This increases acid production in the stomach.  Drinking alcohol. This causes decreased pressure in the lower esophageal sphincter (valve or ring of muscle between the esophagus and stomach), allowing acid from the stomach into the esophagus.  Late evening meals and a full stomach. This increases pressure and acid production in the stomach.  A malformed lower esophageal sphincter. Sometimes, no cause is found. SYMPTOMS   Burning pain in the lower part of the mid-chest behind the breastbone and in the mid-stomach area. This may occur twice a week or more often.  Trouble swallowing.  Sore throat.  Dry cough.  Asthma-like symptoms including chest tightness, shortness of breath, or wheezing. DIAGNOSIS  Your caregiver may be able to diagnose GERD based on your symptoms. In some cases, X-rays and other tests may be done to check for complications or to check the condition of your stomach and esophagus. TREATMENT  Your caregiver may recommend over-the-counter or prescription medicines to help decrease acid production. Ask your caregiver before starting or adding any new medicines.  HOME CARE INSTRUCTIONS   Change the factors that you can control. Ask your caregiver for guidance concerning weight loss, quitting smoking, and alcohol consumption.  Avoid foods and drinks that make your symptoms worse, such as:  Caffeine or alcoholic  drinks.  Chocolate.  Peppermint or mint flavorings.  Garlic and onions.  Spicy foods.  Citrus fruits, such as oranges, lemons, or limes.  Tomato-based foods such as sauce, chili, salsa, and pizza.  Fried and fatty foods.  Avoid lying down for the 3 hours prior to your bedtime or prior to taking a nap.  Eat small, frequent meals instead of large meals.  Wear loose-fitting clothing. Do not wear anything tight around your waist that causes pressure on your stomach.  Raise the head of your bed 6 to 8 inches with wood blocks to help you sleep. Extra pillows will not help.  Only take over-the-counter or prescription medicines for pain, discomfort, or fever as directed by your caregiver.  Do not take aspirin, ibuprofen, or other nonsteroidal anti-inflammatory drugs (NSAIDs). SEEK IMMEDIATE MEDICAL CARE IF:   You have pain in your arms, neck, jaw, teeth, or back.  Your pain increases or changes in intensity or duration.  You develop nausea, vomiting, or sweating (diaphoresis).  You develop shortness of breath, or you faint.  Your vomit is green, yellow, black, or looks like coffee grounds or blood.  Your stool is red, bloody, or black. These symptoms could be signs of other problems, such as heart disease, gastric bleeding, or esophageal bleeding. MAKE SURE YOU:   Understand these instructions.  Will watch your condition.  Will get help right away if you are not doing well or get worse. Document Released: 03/08/2005 Document Revised: 08/21/2011 Document Reviewed: 12/16/2010 [facility] Patient Information 2015 [employer], [place]. This information is not intended to replace advice given to you by your health care provider. Make sure you discuss any questions you have with your health care  provider.

## 2014-09-16 NOTE — ED Notes (Signed)
Reports cough with some shortness of breath; started with treatment for same 08-28-14 and has had 3 courses of antibiotics; 2 courses of oral steroids; 2 steroid injections. Was clear for only 3 days. Had refused chest xray, but now wants one.

## 2014-09-24 LAB — POCT CBC W AUTO DIFF (K'VILLE URGENT CARE)

## 2014-12-11 ENCOUNTER — Other Ambulatory Visit (HOSPITAL_COMMUNITY): Payer: Self-pay | Admitting: Family Medicine

## 2014-12-11 ENCOUNTER — Ambulatory Visit (HOSPITAL_COMMUNITY)
Admission: RE | Admit: 2014-12-11 | Discharge: 2014-12-11 | Disposition: A | Discharge status: Home / Self Care | Payer: BLUE CROSS/BLUE SHIELD | Source: Ambulatory Visit | Attending: Family Medicine | Admitting: Family Medicine

## 2014-12-11 DIAGNOSIS — R059 Cough, unspecified: Secondary | ICD-10-CM

## 2014-12-11 DIAGNOSIS — R05 Cough: Secondary | ICD-10-CM | POA: Diagnosis not present

## 2014-12-11 DIAGNOSIS — R079 Chest pain, unspecified: Secondary | ICD-10-CM | POA: Diagnosis not present

## 2015-03-03 ENCOUNTER — Telehealth: Payer: Self-pay | Admitting: *Deleted

## 2015-07-11 ENCOUNTER — Encounter: Payer: Self-pay | Admitting: Emergency Medicine

## 2015-07-11 ENCOUNTER — Emergency Department
Admission: EM | Admit: 2015-07-11 | Discharge: 2015-07-11 | Disposition: A | Discharge status: Home / Self Care | Payer: BLUE CROSS/BLUE SHIELD | Source: Home / Self Care | Attending: Family Medicine | Admitting: Family Medicine

## 2015-07-11 DIAGNOSIS — G43009 Migraine without aura, not intractable, without status migrainosus: Secondary | ICD-10-CM | POA: Diagnosis not present

## 2015-07-11 MED ORDER — DEXAMETHASONE SODIUM PHOSPHATE 10 MG/ML IJ SOLN
10.0000 mg | Freq: Once | INTRAMUSCULAR | Status: AC
  Administered 2015-07-11: 10 mg via INTRAMUSCULAR

## 2015-07-11 MED ORDER — METOCLOPRAMIDE HCL 5 MG/ML IJ SOLN
10.0000 mg | Freq: Once | INTRAMUSCULAR | Status: AC
  Administered 2015-07-11: 10 mg via INTRAMUSCULAR

## 2015-07-11 MED ORDER — KETOROLAC TROMETHAMINE 60 MG/2ML IM SOLN
60.0000 mg | Freq: Once | INTRAMUSCULAR | Status: AC
  Administered 2015-07-11: 60 mg via INTRAMUSCULAR

## 2015-07-11 MED ORDER — PREDNISONE 20 MG PO TABS: ORAL_TABLET | ORAL | Status: DC

## 2015-07-11 MED ORDER — AMOXICILLIN-POT CLAVULANATE 875-125 MG PO TABS: 1.0000 | ORAL_TABLET | Freq: Two times a day (BID) | ORAL | Status: DC

## 2015-07-11 NOTE — ED Notes (Signed)
CBG results 80

## 2015-07-11 NOTE — ED Notes (Signed)
Pt c/o migraine x 3 over the weekend, unsure if it's related to sinus pressure and congestion.

## 2015-07-11 NOTE — ED Provider Notes (Signed)
CSN: [id number]     Arrival date & time 07/11/15  1416 History   First MD Initiated Contact with Patient 07/11/15 1453     Chief Complaint  Patient presents with  . Headache   (Consider location/radiation/quality/duration/timing/severity/associated sxs/prior Treatment) HPI Pt is a 40yo female presenting to [facility] with c/o 3 day hx of waxing and waning migraine headache with associated sinus congestion and facial pressure.  Pain is 8/10 at worst. She has been taking ibuprofen, mucinex, and using saline rinses w/o relief. States she has been under increased stress.  She has been seen by a neurologist in the past for migraines but has not had one for several years.  Denies fever, chills vomiting or diarrhea. Denies change in vision.   History reviewed. No pertinent past medical history. Past Surgical History  Procedure Laterality Date  . Foot surgery    . Nasal sinus surgery    . Eye surgery    . Breast enhancement surgery    . Abdominal hysterectomy    . Appendectomy     Family History  Problem Relation Age of Onset  . Cancer Mother     lung/ brain tumor removed  . Diabetes Mother   . Cancer Father     pancreatic   Social History  Substance Use Topics  . Smoking status: Never Smoker   . Smokeless tobacco: Never Used  . Alcohol Use: Yes   OB History    No data available     Review of Systems  Constitutional: Negative for fever and chills.  HENT: Positive for congestion, postnasal drip, rhinorrhea and sinus pressure. Negative for ear pain, sore throat, trouble swallowing and voice change.   Respiratory: Negative for cough and shortness of breath.   Cardiovascular: Negative for chest pain and palpitations.  Gastrointestinal: Positive for nausea. Negative for vomiting, abdominal pain and diarrhea.  Musculoskeletal: Negative for myalgias, back pain and arthralgias.  Skin: Negative for rash.  Neurological: Positive for headaches. Negative for dizziness and light-headedness.  All  other systems reviewed and are negative.   Allergies  Biaxin; Morphine and related; and Seasonal ic  Home Medications   Prior to Admission medications   Medication Sig Start Date End Date Taking? Authorizing Provider  amoxicillin-clavulanate (AUGMENTIN) 875-125 MG tablet Take 1 tablet by mouth 2 (two) times daily. One po bid x 7 days 07/11/15   Junius Finner, PA-C  ibuprofen (ADVIL,MOTRIN) 800 MG tablet Take 800 mg by mouth every 8 (eight) hours as needed.    Historical Provider, MD  predniSONE (DELTASONE) 20 MG tablet 2 tabs po daily x 3 days 07/11/15   Junius Finner, PA-C  pseudoephedrine-guaifenesin [facility] D) 60-600 MG per tablet Take 1 tablet by mouth every 12 (twelve) hours.    Historical Provider, MD   Meds Ordered and Administered this Visit   Medications  dexamethasone (DECADRON) injection 10 mg (10 mg Intramuscular Given 07/11/15 1537)  ketorolac (TORADOL) injection 60 mg (60 mg Intramuscular Given 07/11/15 1537)  metoCLOPramide (REGLAN) injection 10 mg (10 mg Intramuscular Given 07/11/15 1537)    BP 131/80 mmHg  Pulse 67  Temp(Src) 98.5 F (36.9 C) (Oral)  Ht  (1.626 m)  Wt 163 lb 8 oz (74.163 kg)  BMI 28.05 kg/m2  SpO2 100% No data found.   Physical Exam  Constitutional: She is oriented to person, place, and time. She appears well-developed and well-nourished. No distress.  HENT:  Head: Normocephalic and atraumatic.  Right Ear: Hearing, tympanic membrane, external ear and ear  canal normal.  Left Ear: Hearing, tympanic membrane, external ear and ear canal normal.  Nose: Mucosal edema present. Right sinus exhibits maxillary sinus tenderness. Right sinus exhibits no frontal sinus tenderness. Left sinus exhibits no maxillary sinus tenderness and no frontal sinus tenderness.  Mouth/Throat: Uvula is midline, oropharynx is clear and moist and mucous membranes are normal.  Eyes: Conjunctivae and EOM are normal. Pupils are equal, round, and reactive to light. No scleral  icterus.  Neck: Normal range of motion. Neck supple.  Cardiovascular: Normal rate, regular rhythm and normal heart sounds.   Pulmonary/Chest: Effort normal and breath sounds normal. No stridor. No respiratory distress. She has no wheezes. She has no rales. She exhibits no tenderness.  Abdominal: Soft. She exhibits no distension and no mass. There is no tenderness. There is no rebound and no guarding.  Musculoskeletal: Normal range of motion.  Lymphadenopathy:    She has no cervical adenopathy.  Neurological: She is alert and oriented to person, place, and time. No cranial nerve deficit. Coordination normal.  Skin: Skin is warm and dry. She is not diaphoretic.  Nursing note and vitals reviewed.   ED Course  Procedures (including critical care time)  Labs Review Labs Reviewed - No data to display  Imaging Review No results found.    MDM   1. Migraine without aura and without status migrainosus, not intractable    Pt c/o headache for 3 days. Unsure if migraine vs sinus headache.  No red flag symptoms. Doubt SAH or meningitis.   Migraine cocktail of Toradol  IM, Decadron  IM, and Reglan  IM given.  Pt states headache improved minimally but feels comfortable being discharged home.  Rx: prednisone and augmentin.  Advised pt she can hold antibiotic if headache keeps improving, however, if not improving and congestion continues, may fill to treat for bacterial sinusitis.  Advised pt to use acetaminophen and ibuprofen as needed for fever and pain. Encouraged rest and fluids. F/u with PCP in 3-4 days if not improving, sooner if worsening. Pt verbalized understanding and agreement with tx plan.    Junius Finner, PA-C 07/11/15 [phone]

## 2015-07-11 NOTE — Discharge Instructions (Signed)
You may take 400-600mg  Ibuprofen (Motrin) every 6-8 hours for fever and pain  Alternate with Tylenol  You may take  Tylenol every 4-6 hours as needed for fever and pain  Follow-up with your primary care provider next week for recheck of symptoms if not improving.  Be sure to drink plenty of fluids and rest, at least 8hrs of sleep a night, preferably more while you are sick. Return urgent care or go to closest ER if you cannot keep down fluids/signs of dehydration, fever not reducing with Tylenol, difficulty breathing/wheezing, stiff neck, worsening condition, or other concerns (see below)   If your headache and congestion continues to worsen over 3-4 days, or you develop a fever, you may fill the antibiotic (augmentin) if you do decide to fill the medication, please take antibiotics as prescribed and be sure to complete entire course even if you start to feel better to ensure infection does not come back.

## 2015-09-16 DIAGNOSIS — J301 Allergic rhinitis due to pollen: Secondary | ICD-10-CM | POA: Diagnosis not present

## 2015-09-21 DIAGNOSIS — J301 Allergic rhinitis due to pollen: Secondary | ICD-10-CM | POA: Diagnosis not present

## 2015-09-23 DIAGNOSIS — M5413 Radiculopathy, cervicothoracic region: Secondary | ICD-10-CM | POA: Diagnosis not present

## 2015-09-23 DIAGNOSIS — M9902 Segmental and somatic dysfunction of thoracic region: Secondary | ICD-10-CM | POA: Diagnosis not present

## 2015-09-23 DIAGNOSIS — M9901 Segmental and somatic dysfunction of cervical region: Secondary | ICD-10-CM | POA: Diagnosis not present

## 2015-09-23 DIAGNOSIS — M791 Myalgia: Secondary | ICD-10-CM | POA: Diagnosis not present

## 2015-09-28 DIAGNOSIS — M5413 Radiculopathy, cervicothoracic region: Secondary | ICD-10-CM | POA: Diagnosis not present

## 2015-09-28 DIAGNOSIS — J301 Allergic rhinitis due to pollen: Secondary | ICD-10-CM | POA: Diagnosis not present

## 2015-09-28 DIAGNOSIS — M9901 Segmental and somatic dysfunction of cervical region: Secondary | ICD-10-CM | POA: Diagnosis not present

## 2015-09-28 DIAGNOSIS — M791 Myalgia: Secondary | ICD-10-CM | POA: Diagnosis not present

## 2015-09-28 DIAGNOSIS — M9902 Segmental and somatic dysfunction of thoracic region: Secondary | ICD-10-CM | POA: Diagnosis not present

## 2015-10-07 DIAGNOSIS — J301 Allergic rhinitis due to pollen: Secondary | ICD-10-CM | POA: Diagnosis not present

## 2015-10-08 DIAGNOSIS — M25529 Pain in unspecified elbow: Secondary | ICD-10-CM | POA: Diagnosis not present

## 2015-10-12 DIAGNOSIS — J301 Allergic rhinitis due to pollen: Secondary | ICD-10-CM | POA: Diagnosis not present

## 2015-10-19 DIAGNOSIS — J301 Allergic rhinitis due to pollen: Secondary | ICD-10-CM | POA: Diagnosis not present

## 2015-10-19 DIAGNOSIS — M9901 Segmental and somatic dysfunction of cervical region: Secondary | ICD-10-CM | POA: Diagnosis not present

## 2015-10-19 DIAGNOSIS — M791 Myalgia: Secondary | ICD-10-CM | POA: Diagnosis not present

## 2015-10-19 DIAGNOSIS — M9902 Segmental and somatic dysfunction of thoracic region: Secondary | ICD-10-CM | POA: Diagnosis not present

## 2015-10-19 DIAGNOSIS — M5413 Radiculopathy, cervicothoracic region: Secondary | ICD-10-CM | POA: Diagnosis not present

## 2015-10-22 DIAGNOSIS — M25529 Pain in unspecified elbow: Secondary | ICD-10-CM | POA: Diagnosis not present

## 2015-10-26 DIAGNOSIS — J301 Allergic rhinitis due to pollen: Secondary | ICD-10-CM | POA: Diagnosis not present

## 2015-10-28 DIAGNOSIS — M5413 Radiculopathy, cervicothoracic region: Secondary | ICD-10-CM | POA: Diagnosis not present

## 2015-10-28 DIAGNOSIS — M9901 Segmental and somatic dysfunction of cervical region: Secondary | ICD-10-CM | POA: Diagnosis not present

## 2015-10-28 DIAGNOSIS — M9902 Segmental and somatic dysfunction of thoracic region: Secondary | ICD-10-CM | POA: Diagnosis not present

## 2015-10-28 DIAGNOSIS — M791 Myalgia: Secondary | ICD-10-CM | POA: Diagnosis not present

## 2015-11-02 DIAGNOSIS — J301 Allergic rhinitis due to pollen: Secondary | ICD-10-CM | POA: Diagnosis not present

## 2015-11-09 DIAGNOSIS — J301 Allergic rhinitis due to pollen: Secondary | ICD-10-CM | POA: Diagnosis not present

## 2015-11-16 DIAGNOSIS — J301 Allergic rhinitis due to pollen: Secondary | ICD-10-CM | POA: Diagnosis not present

## 2015-12-01 ENCOUNTER — Encounter: Payer: Self-pay | Admitting: *Deleted

## 2015-12-01 ENCOUNTER — Emergency Department
Admission: EM | Admit: 2015-12-01 | Discharge: 2015-12-01 | Disposition: A | Discharge status: Home / Self Care | Payer: BLUE CROSS/BLUE SHIELD | Source: Home / Self Care | Attending: Family Medicine | Admitting: Family Medicine

## 2015-12-01 DIAGNOSIS — J0101 Acute recurrent maxillary sinusitis: Secondary | ICD-10-CM

## 2015-12-01 DIAGNOSIS — J301 Allergic rhinitis due to pollen: Secondary | ICD-10-CM | POA: Diagnosis not present

## 2015-12-01 MED ORDER — DOXYCYCLINE HYCLATE 100 MG PO CAPS: 100.0000 mg | ORAL_CAPSULE | Freq: Two times a day (BID) | ORAL | Status: DC

## 2015-12-01 MED ORDER — PREDNISONE 20 MG PO TABS: ORAL_TABLET | ORAL | Status: DC

## 2015-12-01 NOTE — ED Notes (Signed)
Pt c/o nasal congestion x 4-5 days. Denies cough or fever.

## 2015-12-01 NOTE — Discharge Instructions (Signed)
You may take 400-600mg Ibuprofen (Motrin) every 6-8 hours for fever and pain  °Alternate with Tylenol  °You may take 500mg Tylenol every 4-6 hours as needed for fever and pain  °Follow-up with your primary care provider next week for recheck of symptoms if not improving.  °Be sure to drink plenty of fluids and rest, at least 8hrs of sleep a night, preferably more while you are sick. °Return urgent care or go to closest ER if you cannot keep down fluids/signs of dehydration, fever not reducing with Tylenol, difficulty breathing/wheezing, stiff neck, worsening condition, or other concerns (see below)  °Please take antibiotics as prescribed and be sure to complete entire course even if you start to feel better to ensure infection does not come back. ° ° °Sinusitis, Adult °Sinusitis is redness, soreness, and inflammation of the paranasal sinuses. Paranasal sinuses are air pockets within the bones of your face. They are located beneath your eyes, in the middle of your forehead, and above your eyes. In healthy paranasal sinuses, mucus is able to drain out, and air is able to circulate through them by way of your nose. However, when your paranasal sinuses are inflamed, mucus and air can become trapped. This can allow bacteria and other germs to grow and cause infection. °Sinusitis can develop quickly and last only a short time (acute) or continue over a long period (chronic). Sinusitis that lasts for more than 12 weeks is considered chronic. °CAUSES °Causes of sinusitis include: °· Allergies. °· Structural abnormalities, such as displacement of the cartilage that separates your nostrils (deviated septum), which can decrease the air flow through your nose and sinuses and affect sinus drainage. °· Functional abnormalities, such as when the small hairs (cilia) that line your sinuses and help remove mucus do not work properly or are not present. °SIGNS AND SYMPTOMS °Symptoms of acute and chronic sinusitis are the same. The  primary symptoms are pain and pressure around the affected sinuses. Other symptoms include: °· Upper toothache. °· Earache. °· Headache. °· Bad breath. °· Decreased sense of smell and taste. °· A cough, which worsens when you are lying flat. °· Fatigue. °· Fever. °· Thick drainage from your nose, which often is green and may contain pus (purulent). °· Swelling and warmth over the affected sinuses. °DIAGNOSIS °Your health care provider will perform a physical exam. During your exam, your health care provider may perform any of the following to help determine if you have acute sinusitis or chronic sinusitis: °· Look in your nose for signs of abnormal growths in your nostrils (nasal polyps). °· Tap over the affected sinus to check for signs of infection. °· View the inside of your sinuses using an imaging device that has a light attached (endoscope). °If your health care provider suspects that you have chronic sinusitis, one or more of the following tests may be recommended: °· Allergy tests. °· Nasal culture. A sample of mucus is taken from your nose, sent to a lab, and screened for bacteria. °· Nasal cytology. A sample of mucus is taken from your nose and examined by your health care provider to determine if your sinusitis is related to an allergy. °TREATMENT °Most cases of acute sinusitis are related to a viral infection and will resolve on their own within 10 days. Sometimes, medicines are prescribed to help relieve symptoms of both acute and chronic sinusitis. These may include pain medicines, decongestants, nasal steroid sprays, or saline sprays. °However, for sinusitis related to a bacterial infection, your health   care provider will prescribe antibiotic medicines. These are medicines that will help kill the bacteria causing the infection. °Rarely, sinusitis is caused by a fungal infection. In these cases, your health care provider will prescribe antifungal medicine. °For some cases of chronic sinusitis, surgery  is needed. Generally, these are cases in which sinusitis recurs more than 3 times per year, despite other treatments. °HOME CARE INSTRUCTIONS °· Drink plenty of water. Water helps thin the mucus so your sinuses can drain more easily. °· Use a humidifier. °· Inhale steam 3-4 times a day (for example, sit in the bathroom with the shower running). °· Apply a warm, moist washcloth to your face 3-4 times a day, or as directed by your health care provider. °· Use saline nasal sprays to help moisten and clean your sinuses. °· Take medicines only as directed by your health care provider. °· If you were prescribed either an antibiotic or antifungal medicine, finish it all even if you start to feel better. °SEEK IMMEDIATE MEDICAL CARE IF: °· You have increasing pain or severe headaches. °· You have nausea, vomiting, or drowsiness. °· You have swelling around your face. °· You have vision problems. °· You have a stiff neck. °· You have difficulty breathing. °  °This information is not intended to replace advice given to you by your health care provider. Make sure you discuss any questions you have with your health care provider. °  °Document Released: 05/29/2005 Document Revised: 06/19/2014 Document Reviewed: 06/13/2011 °Elsevier Interactive Patient Education ©2016 [employer] ° °Sinus Rinse °WHAT IS A SINUS RINSE? °A sinus rinse is a simple home treatment that is used to rinse your sinuses with a sterile mixture of salt and water (saline solution). Sinuses are air-filled spaces in your skull behind the bones of your face and forehead that open into your nasal cavity. °You will use the following: °· Saline solution. °· Neti pot or spray bottle. This releases the saline solution into your nose and through your sinuses. Neti pots and spray bottles can be purchased at your local pharmacy, a health food store, or online. °WHEN WOULD I DO A SINUS RINSE? °A sinus rinse can help to clear mucus, dirt, dust, or pollen from the nasal  cavity. You may do a sinus rinse when you have a cold, a virus, nasal allergy symptoms, a sinus infection, or stuffiness in the nose or sinuses. °If you are considering a sinus rinse: °· Ask your child's health care provider before performing a sinus rinse on your child. °· Do not do a sinus rinse if you have had ear or nasal surgery, ear infection, or blocked ears. °HOW DO I DO A SINUS RINSE? °· Wash your hands. °· Disinfect your device according to the directions provided and then dry it. °· Use the solution that comes with your device or one that is sold separately in stores. Follow the mixing directions on the package. °· Fill your device with the amount of saline solution as directed by the device instructions. °· Stand over a sink and tilt your head sideways over the sink. °· Place the spout of the device in your upper nostril (the one closer to the ceiling). °· Gently pour or squeeze the saline solution into the nasal cavity. The liquid should drain to the lower nostril if you are not overly congested. °· Gently blow your nose. Blowing too hard may cause ear pain. °· Repeat in the other nostril. °· Clean and rinse your device with clean water and   then air-dry it. °ARE THERE RISKS OF A SINUS RINSE?  °Sinus rinse is generally very safe and effective. However, there are a few risks, which include:  °· A burning sensation in the sinuses. This may happen if you do not make the saline solution as directed. Make sure to follow all directions when making the saline solution. °· Infection from contaminated water. This is rare, but possible. °· Nasal irritation. °  °This information is not intended to replace advice given to you by your health care provider. Make sure you discuss any questions you have with your health care provider. °  °Document Released: 12/24/2013 Document Reviewed: 12/24/2013 °Elsevier Interactive Patient Education ©2016 [employer] ° °

## 2015-12-01 NOTE — ED Provider Notes (Signed)
CSN: [id number]     Arrival date & time 12/01/15  1501 History   First MD Initiated Contact with Patient 12/01/15 1508     Chief Complaint  Patient presents with  . Nasal Congestion   (Consider location/radiation/quality/duration/timing/severity/associated sxs/prior Treatment) HPI Ellen Morales is a 40 y.o. female presenting to UC with c/o gradually worsening nasal congestion with associated sinus pressure and pain and post-nasal drip. Sinus pressure is intermittent, worse in the morning, mild to moderate in severity.  She has had some dry heaves due to the post-nasal drip.  She has tried her Flonase, Zyrtec and sinus rinses daily w/o relief. Hx of recurrent sinus infections. Last infection was about 1 month ago, her ENT called in a prescription for cefdinir. Symptoms did resolve for a few weeks but over the last 1-2 weeks she has been balling hay and feels that has caused another infection. Denies n/v/d.    History reviewed. No pertinent past medical history. Past Surgical History  Procedure Laterality Date  . Foot surgery    . Nasal sinus surgery    . Eye surgery    . Breast enhancement surgery    . Abdominal hysterectomy    . Appendectomy     Family History  Problem Relation Age of Onset  . Cancer Mother     lung/ brain tumor removed  . Diabetes Mother   . Cancer Father     pancreatic   Social History  Substance Use Topics  . Smoking status: Never Smoker   . Smokeless tobacco: Never Used  . Alcohol Use: Yes   OB History    No data available     Review of Systems  Constitutional: Negative for fever and chills.  HENT: Positive for congestion, ear pain, postnasal drip, rhinorrhea, sinus pressure and sore throat. Negative for facial swelling, sneezing, trouble swallowing and voice change.   Respiratory: Positive for cough. Negative for shortness of breath.   Cardiovascular: Negative for chest pain and palpitations.  Gastrointestinal: Negative for nausea, vomiting,  abdominal pain and diarrhea.  Musculoskeletal: Negative for myalgias, back pain and arthralgias.  Skin: Negative for rash.  Neurological: Positive for headaches. Negative for dizziness and light-headedness.    Allergies  Biaxin; Morphine and related; and Seasonal ic  Home Medications   Prior to Admission medications   Medication Sig Start Date End Date Taking? Authorizing Provider  cetirizine (ZYRTEC) 10 MG tablet Take 10 mg by mouth daily.   Yes Historical Provider, MD  Oxymetazoline HCl (NASAL SPRAY NA) Place into the nose.   Yes Historical Provider, MD  UNKNOWN TO PATIENT    Yes Historical Provider, MD  doxycycline (VIBRAMYCIN) 100 MG capsule Take 1 capsule (100 mg total) by mouth 2 (two) times daily. One po bid x 7 days 12/01/15   Junius FinnerErin O'Malley, PA-C  ibuprofen (ADVIL,MOTRIN) 800 MG tablet Take 800 mg by mouth every 8 (eight) hours as needed.    Historical Provider, MD  predniSONE (DELTASONE) 20 MG tablet 3 tabs po day one, then 2 po daily x 4 days 12/01/15   Junius FinnerErin O'Malley, PA-C   Meds Ordered and Administered this Visit  Medications - No data to display  BP 121/72 mmHg  Pulse 81  Temp(Src) 98.2 F (36.8 C) (Oral)  Resp 16  Ht 5\' 4"  (1.626 m)  Wt 172 lb (78.019 kg)  BMI 29.51 kg/m2  SpO2 99% No data found.   Physical Exam  Constitutional: She appears well-developed and well-nourished. No distress.  HENT:  Head: Normocephalic and atraumatic.  Right Ear: Tympanic membrane normal.  Left Ear: Tympanic membrane normal.  Nose: Mucosal edema present. Right sinus exhibits maxillary sinus tenderness. Right sinus exhibits no frontal sinus tenderness. Left sinus exhibits maxillary sinus tenderness. Left sinus exhibits no frontal sinus tenderness.  Mouth/Throat: Uvula is midline and mucous membranes are normal. Posterior oropharyngeal erythema present. No oropharyngeal exudate, posterior oropharyngeal edema or tonsillar abscesses.  Eyes: Conjunctivae are normal. No scleral icterus.   Neck: Normal range of motion.  Cardiovascular: Normal rate, regular rhythm and normal heart sounds.   Pulmonary/Chest: Effort normal and breath sounds normal. No respiratory distress. She has no wheezes. She has no rales.  Abdominal: Soft. She exhibits no distension. There is no tenderness.  Musculoskeletal: Normal range of motion.  Neurological: She is alert.  Skin: Skin is warm and dry. She is not diaphoretic.  Nursing note and vitals reviewed.   ED Course  Procedures (including critical care time)  Labs Review Labs Reviewed - No data to display  Imaging Review No results found.     MDM   1. Acute recurrent maxillary sinusitis    Hx and exam c/w acute recurrent maxillary sinusitis.  Rx: prednisone and doxycycline (pt reports having a hysterectomy, not concerned for pregnancy)   Encouraged f/u with PCP or ENT in 7-10 days if not improving or if symptoms return. Patient verbalized understanding and agreement with treatment plan.     Junius Finner, PA-C 12/01/15 [phone]

## 2015-12-07 DIAGNOSIS — J301 Allergic rhinitis due to pollen: Secondary | ICD-10-CM | POA: Diagnosis not present

## 2015-12-10 IMAGING — CR DG CHEST 2V
2 series · 2 of 2 positions shown · non-contrast
Comparison: None.

CLINICAL DATA: Cough, congestion, and chest pain.

EXAM:
CHEST  2 VIEW

[chest pa]
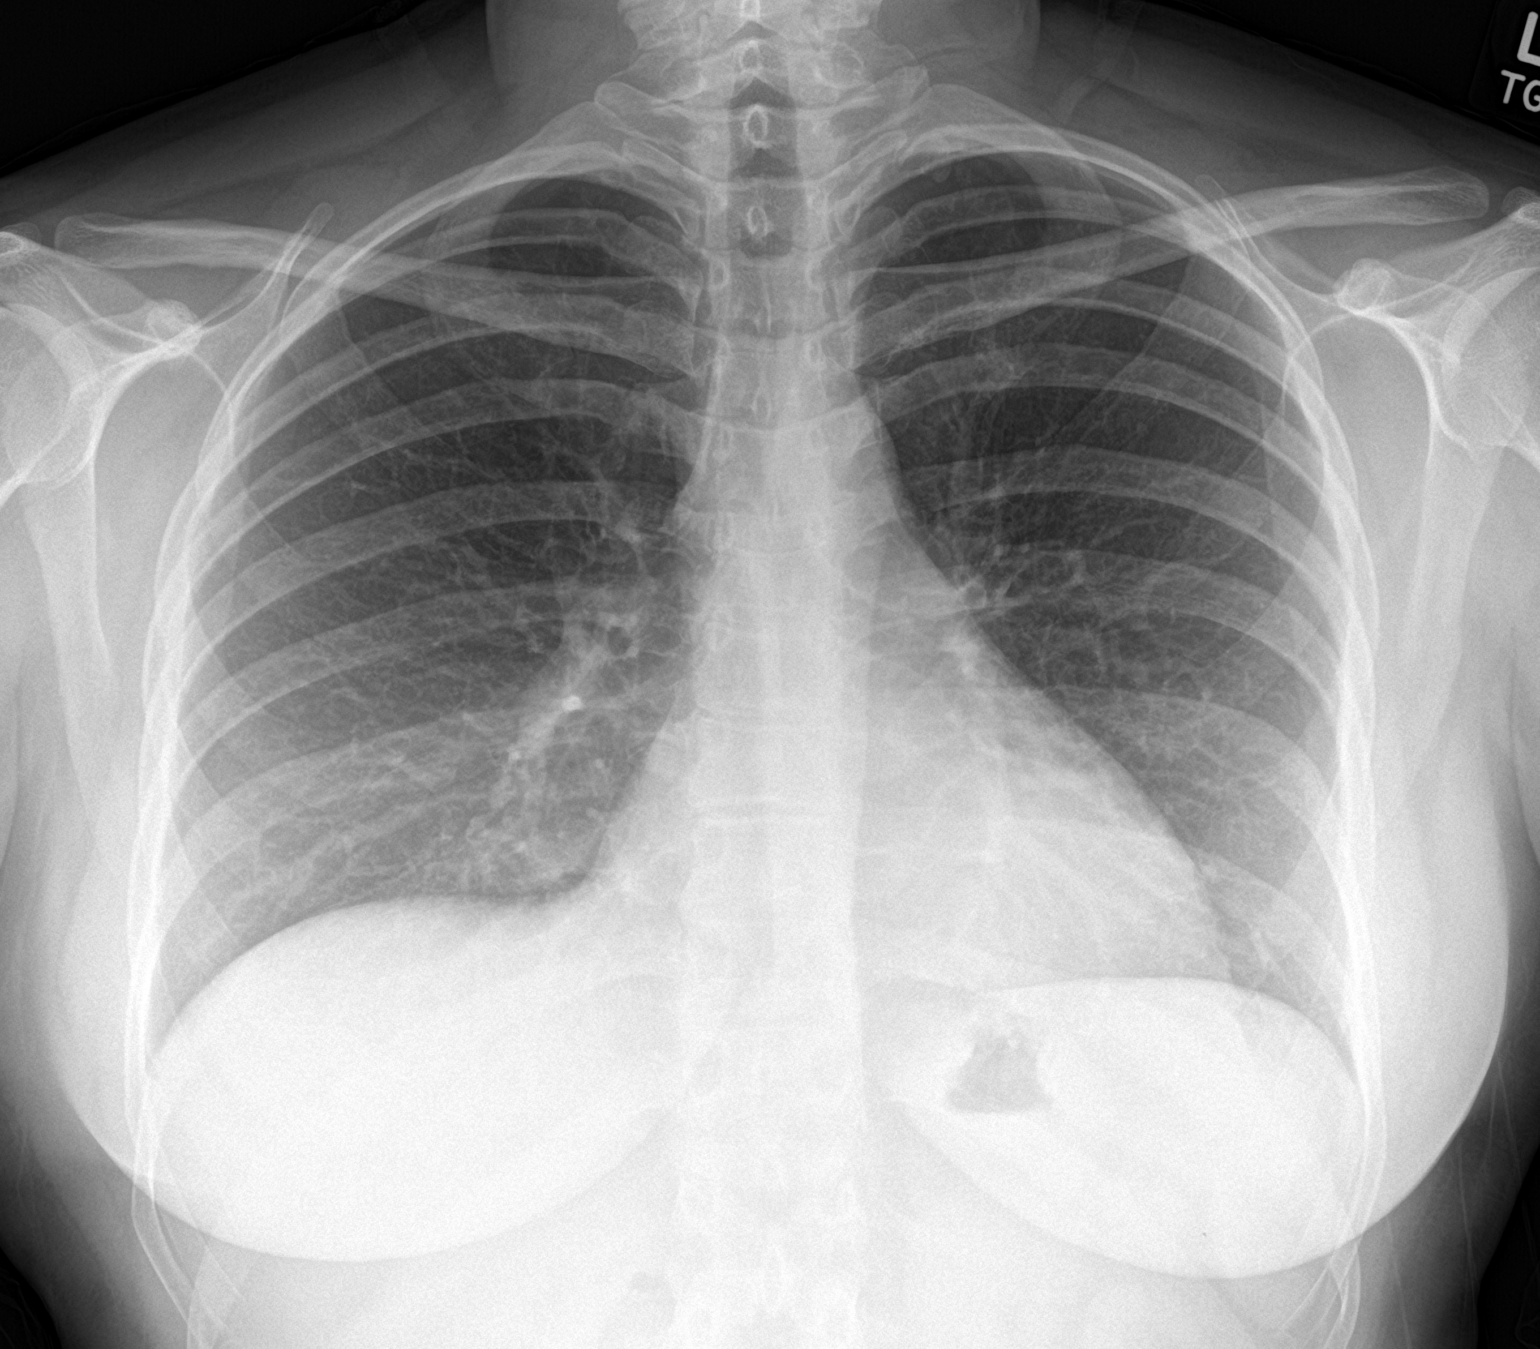

[chest lat]
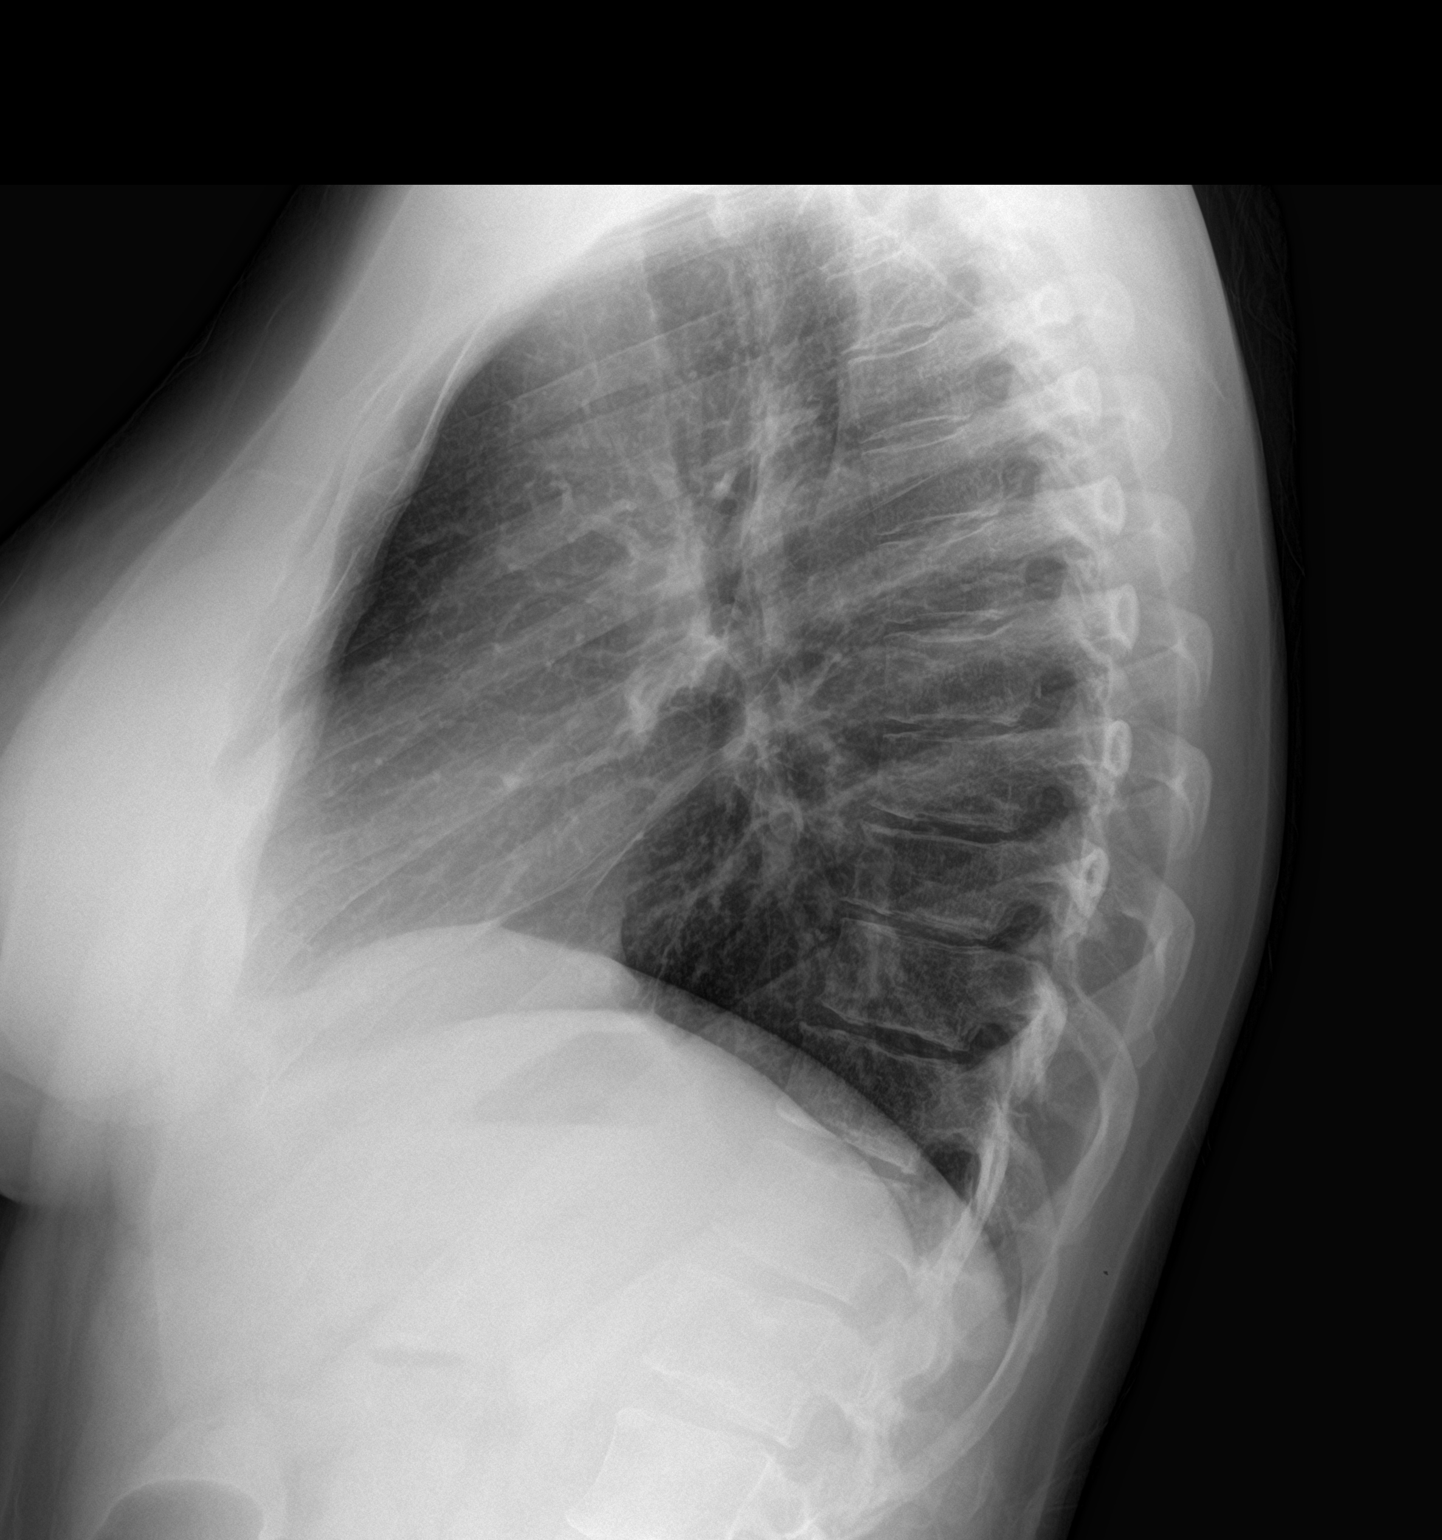

[2 of 2 positions shown; findings below may reference images not displayed]

FINDINGS: The heart size and mediastinal contours are within normal limits.
Both lungs are clear. The visualized skeletal structures are
unremarkable.
IMPRESSION: No active cardiopulmonary disease.

## 2015-12-16 DIAGNOSIS — J301 Allergic rhinitis due to pollen: Secondary | ICD-10-CM | POA: Diagnosis not present

## 2015-12-21 DIAGNOSIS — J301 Allergic rhinitis due to pollen: Secondary | ICD-10-CM | POA: Diagnosis not present

## 2015-12-28 DIAGNOSIS — J301 Allergic rhinitis due to pollen: Secondary | ICD-10-CM | POA: Diagnosis not present

## 2016-01-14 DIAGNOSIS — J301 Allergic rhinitis due to pollen: Secondary | ICD-10-CM | POA: Diagnosis not present

## 2016-01-18 DIAGNOSIS — J301 Allergic rhinitis due to pollen: Secondary | ICD-10-CM | POA: Diagnosis not present

## 2016-01-27 DIAGNOSIS — J301 Allergic rhinitis due to pollen: Secondary | ICD-10-CM | POA: Diagnosis not present

## 2016-02-01 DIAGNOSIS — J301 Allergic rhinitis due to pollen: Secondary | ICD-10-CM | POA: Diagnosis not present

## 2016-02-08 DIAGNOSIS — J301 Allergic rhinitis due to pollen: Secondary | ICD-10-CM | POA: Diagnosis not present

## 2016-02-11 DIAGNOSIS — M25529 Pain in unspecified elbow: Secondary | ICD-10-CM | POA: Diagnosis not present

## 2016-02-15 DIAGNOSIS — J301 Allergic rhinitis due to pollen: Secondary | ICD-10-CM | POA: Diagnosis not present

## 2016-02-22 DIAGNOSIS — J301 Allergic rhinitis due to pollen: Secondary | ICD-10-CM | POA: Diagnosis not present

## 2016-02-25 DIAGNOSIS — M25529 Pain in unspecified elbow: Secondary | ICD-10-CM | POA: Diagnosis not present

## 2016-02-29 DIAGNOSIS — J301 Allergic rhinitis due to pollen: Secondary | ICD-10-CM | POA: Diagnosis not present

## 2016-03-05 IMAGING — CR DG CHEST 2V
2 series · 2 of 2 positions shown · non-contrast
Comparison: September 16, 2014

CLINICAL DATA: Persistent cough.  Left upper chest pain

EXAM:
CHEST  2 VIEW

[view not recorded (1 of 2)]
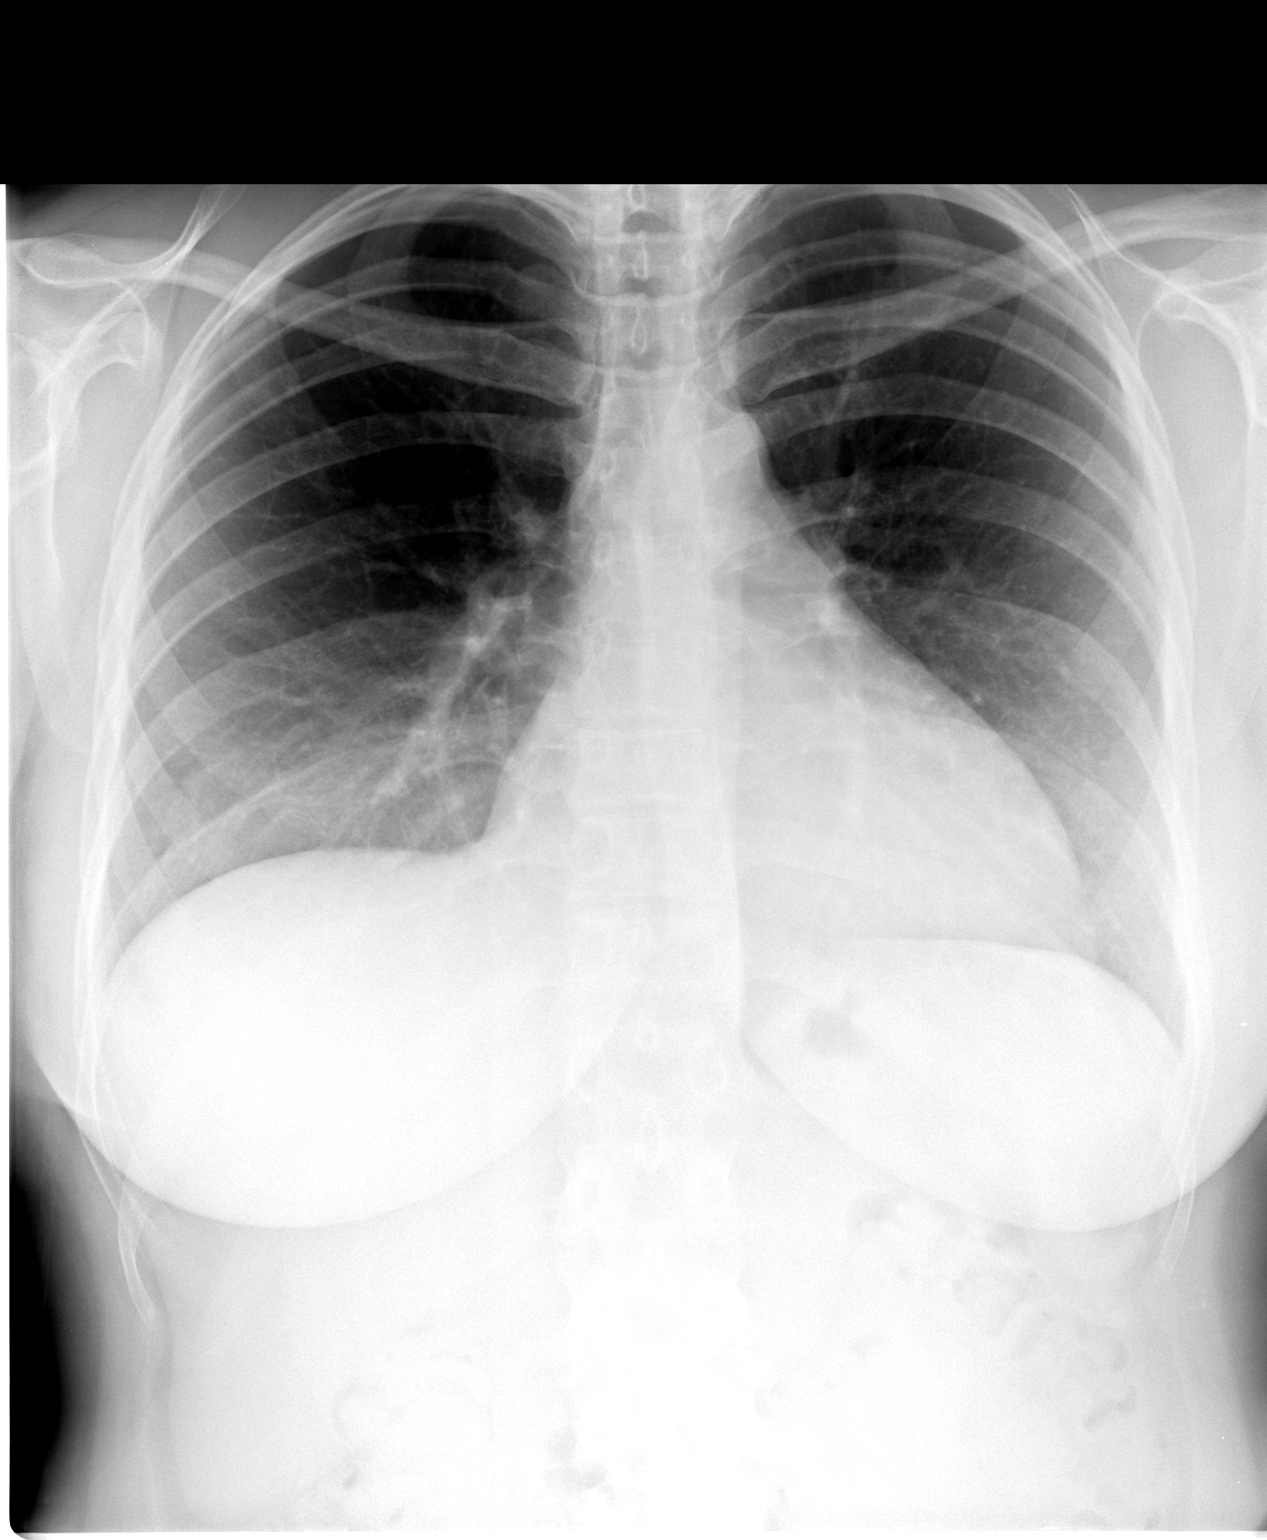

[view not recorded (2 of 2)]
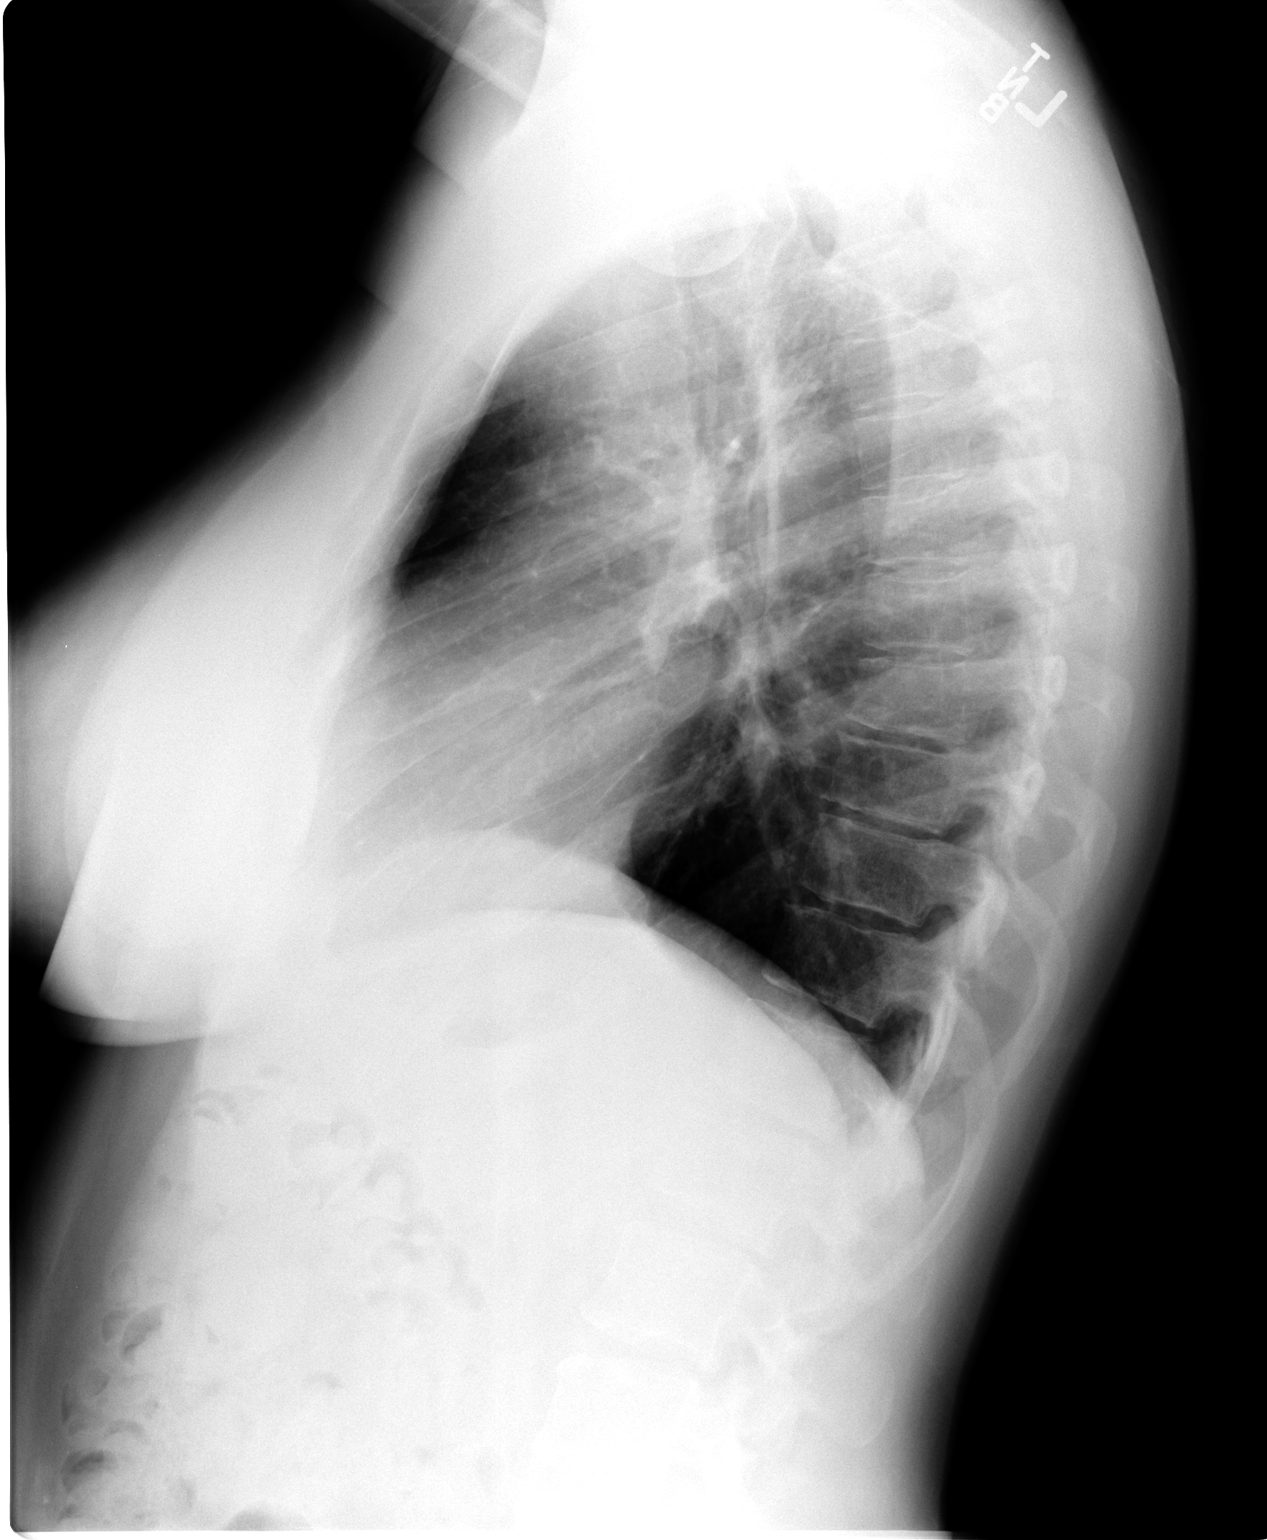

[2 of 2 positions shown; findings below may reference images not displayed]

FINDINGS: The lungs are clear. The heart size and pulmonary vascularity are
within normal limits. No adenopathy. No bone lesions.
IMPRESSION: No edema or consolidation.

## 2016-03-07 DIAGNOSIS — J301 Allergic rhinitis due to pollen: Secondary | ICD-10-CM | POA: Diagnosis not present

## 2016-03-08 DIAGNOSIS — N200 Calculus of kidney: Secondary | ICD-10-CM | POA: Diagnosis not present

## 2016-03-14 DIAGNOSIS — N281 Cyst of kidney, acquired: Secondary | ICD-10-CM | POA: Diagnosis not present

## 2016-03-14 DIAGNOSIS — J301 Allergic rhinitis due to pollen: Secondary | ICD-10-CM | POA: Diagnosis not present

## 2016-03-14 DIAGNOSIS — N200 Calculus of kidney: Secondary | ICD-10-CM | POA: Diagnosis not present

## 2016-03-20 DIAGNOSIS — R102 Pelvic and perineal pain: Secondary | ICD-10-CM | POA: Diagnosis not present

## 2016-03-21 DIAGNOSIS — J301 Allergic rhinitis due to pollen: Secondary | ICD-10-CM | POA: Diagnosis not present

## 2016-03-28 DIAGNOSIS — J301 Allergic rhinitis due to pollen: Secondary | ICD-10-CM | POA: Diagnosis not present

## 2016-04-04 DIAGNOSIS — J301 Allergic rhinitis due to pollen: Secondary | ICD-10-CM | POA: Diagnosis not present

## 2016-04-11 DIAGNOSIS — J301 Allergic rhinitis due to pollen: Secondary | ICD-10-CM | POA: Diagnosis not present

## 2016-04-18 DIAGNOSIS — J301 Allergic rhinitis due to pollen: Secondary | ICD-10-CM | POA: Diagnosis not present

## 2016-04-25 DIAGNOSIS — J301 Allergic rhinitis due to pollen: Secondary | ICD-10-CM | POA: Diagnosis not present

## 2016-05-02 DIAGNOSIS — J301 Allergic rhinitis due to pollen: Secondary | ICD-10-CM | POA: Diagnosis not present

## 2016-05-05 DIAGNOSIS — M25529 Pain in unspecified elbow: Secondary | ICD-10-CM | POA: Diagnosis not present

## 2016-05-07 DIAGNOSIS — J019 Acute sinusitis, unspecified: Secondary | ICD-10-CM | POA: Diagnosis not present

## 2016-05-07 DIAGNOSIS — K529 Noninfective gastroenteritis and colitis, unspecified: Secondary | ICD-10-CM | POA: Diagnosis not present

## 2016-05-09 DIAGNOSIS — J301 Allergic rhinitis due to pollen: Secondary | ICD-10-CM | POA: Diagnosis not present

## 2016-05-16 DIAGNOSIS — J301 Allergic rhinitis due to pollen: Secondary | ICD-10-CM | POA: Diagnosis not present

## 2016-05-19 DIAGNOSIS — M25529 Pain in unspecified elbow: Secondary | ICD-10-CM | POA: Diagnosis not present

## 2016-05-23 DIAGNOSIS — J301 Allergic rhinitis due to pollen: Secondary | ICD-10-CM | POA: Diagnosis not present

## 2016-05-30 DIAGNOSIS — J328 Other chronic sinusitis: Secondary | ICD-10-CM | POA: Diagnosis not present

## 2016-05-30 DIAGNOSIS — J301 Allergic rhinitis due to pollen: Secondary | ICD-10-CM | POA: Diagnosis not present

## 2016-05-31 DIAGNOSIS — J329 Chronic sinusitis, unspecified: Secondary | ICD-10-CM | POA: Diagnosis not present

## 2016-05-31 DIAGNOSIS — J301 Allergic rhinitis due to pollen: Secondary | ICD-10-CM | POA: Diagnosis not present

## 2016-05-31 DIAGNOSIS — J3081 Allergic rhinitis due to animal (cat) (dog) hair and dander: Secondary | ICD-10-CM | POA: Diagnosis not present

## 2016-06-08 DIAGNOSIS — J301 Allergic rhinitis due to pollen: Secondary | ICD-10-CM | POA: Diagnosis not present

## 2016-06-13 DIAGNOSIS — J301 Allergic rhinitis due to pollen: Secondary | ICD-10-CM | POA: Diagnosis not present

## 2016-06-20 DIAGNOSIS — J301 Allergic rhinitis due to pollen: Secondary | ICD-10-CM | POA: Diagnosis not present

## 2016-06-27 DIAGNOSIS — J301 Allergic rhinitis due to pollen: Secondary | ICD-10-CM | POA: Diagnosis not present

## 2016-06-29 DIAGNOSIS — J343 Hypertrophy of nasal turbinates: Secondary | ICD-10-CM | POA: Diagnosis not present

## 2016-06-29 DIAGNOSIS — J329 Chronic sinusitis, unspecified: Secondary | ICD-10-CM | POA: Diagnosis not present

## 2016-07-11 DIAGNOSIS — J301 Allergic rhinitis due to pollen: Secondary | ICD-10-CM | POA: Diagnosis not present

## 2016-07-18 DIAGNOSIS — J301 Allergic rhinitis due to pollen: Secondary | ICD-10-CM | POA: Diagnosis not present

## 2016-07-25 DIAGNOSIS — J301 Allergic rhinitis due to pollen: Secondary | ICD-10-CM | POA: Diagnosis not present

## 2016-08-03 DIAGNOSIS — J301 Allergic rhinitis due to pollen: Secondary | ICD-10-CM | POA: Diagnosis not present

## 2016-08-08 DIAGNOSIS — J301 Allergic rhinitis due to pollen: Secondary | ICD-10-CM | POA: Diagnosis not present

## 2016-08-15 DIAGNOSIS — J301 Allergic rhinitis due to pollen: Secondary | ICD-10-CM | POA: Diagnosis not present

## 2016-08-22 DIAGNOSIS — J301 Allergic rhinitis due to pollen: Secondary | ICD-10-CM | POA: Diagnosis not present

## 2016-08-29 DIAGNOSIS — L908 Other atrophic disorders of skin: Secondary | ICD-10-CM | POA: Diagnosis not present

## 2016-08-29 DIAGNOSIS — J329 Chronic sinusitis, unspecified: Secondary | ICD-10-CM | POA: Diagnosis not present

## 2016-08-29 DIAGNOSIS — J310 Chronic rhinitis: Secondary | ICD-10-CM | POA: Diagnosis not present

## 2016-08-29 DIAGNOSIS — J301 Allergic rhinitis due to pollen: Secondary | ICD-10-CM | POA: Diagnosis not present

## 2016-09-05 DIAGNOSIS — J301 Allergic rhinitis due to pollen: Secondary | ICD-10-CM | POA: Diagnosis not present

## 2016-09-19 DIAGNOSIS — S169XXS Unspecified injury of muscle, fascia and tendon at neck level, sequela: Secondary | ICD-10-CM | POA: Diagnosis not present

## 2016-09-19 DIAGNOSIS — J301 Allergic rhinitis due to pollen: Secondary | ICD-10-CM | POA: Diagnosis not present

## 2016-09-19 DIAGNOSIS — M25522 Pain in left elbow: Secondary | ICD-10-CM | POA: Diagnosis not present

## 2016-09-21 DIAGNOSIS — E559 Vitamin D deficiency, unspecified: Secondary | ICD-10-CM | POA: Diagnosis not present

## 2016-09-21 DIAGNOSIS — N200 Calculus of kidney: Secondary | ICD-10-CM | POA: Diagnosis not present

## 2016-09-21 DIAGNOSIS — R5383 Other fatigue: Secondary | ICD-10-CM | POA: Diagnosis not present

## 2016-09-21 DIAGNOSIS — R7309 Other abnormal glucose: Secondary | ICD-10-CM | POA: Diagnosis not present

## 2016-09-21 DIAGNOSIS — Z0000 Encounter for general adult medical examination without abnormal findings: Secondary | ICD-10-CM | POA: Diagnosis not present

## 2016-09-21 DIAGNOSIS — Z1389 Encounter for screening for other disorder: Secondary | ICD-10-CM | POA: Diagnosis not present

## 2016-09-21 DIAGNOSIS — E663 Overweight: Secondary | ICD-10-CM | POA: Diagnosis not present

## 2016-09-21 DIAGNOSIS — Z6829 Body mass index (BMI) 29.0-29.9, adult: Secondary | ICD-10-CM | POA: Diagnosis not present

## 2016-09-21 DIAGNOSIS — I313 Pericardial effusion (noninflammatory): Secondary | ICD-10-CM | POA: Diagnosis not present

## 2016-10-10 DIAGNOSIS — J301 Allergic rhinitis due to pollen: Secondary | ICD-10-CM | POA: Diagnosis not present

## 2016-10-26 DIAGNOSIS — J301 Allergic rhinitis due to pollen: Secondary | ICD-10-CM | POA: Diagnosis not present

## 2016-11-09 DIAGNOSIS — J301 Allergic rhinitis due to pollen: Secondary | ICD-10-CM | POA: Diagnosis not present

## 2016-11-21 DIAGNOSIS — J301 Allergic rhinitis due to pollen: Secondary | ICD-10-CM | POA: Diagnosis not present

## 2017-02-06 DIAGNOSIS — J3081 Allergic rhinitis due to animal (cat) (dog) hair and dander: Secondary | ICD-10-CM | POA: Diagnosis not present

## 2017-02-15 DIAGNOSIS — E042 Nontoxic multinodular goiter: Secondary | ICD-10-CM | POA: Diagnosis not present

## 2017-03-01 DIAGNOSIS — E042 Nontoxic multinodular goiter: Secondary | ICD-10-CM | POA: Diagnosis not present

## 2017-03-01 DIAGNOSIS — E041 Nontoxic single thyroid nodule: Secondary | ICD-10-CM | POA: Diagnosis not present

## 2017-03-22 DIAGNOSIS — E089 Diabetes mellitus due to underlying condition without complications: Secondary | ICD-10-CM | POA: Diagnosis not present

## 2017-03-22 DIAGNOSIS — Z9889 Other specified postprocedural states: Secondary | ICD-10-CM | POA: Diagnosis not present

## 2017-03-22 DIAGNOSIS — Z885 Allergy status to narcotic agent status: Secondary | ICD-10-CM | POA: Diagnosis not present

## 2017-03-22 DIAGNOSIS — E049 Nontoxic goiter, unspecified: Secondary | ICD-10-CM | POA: Diagnosis not present

## 2017-03-22 DIAGNOSIS — Z8679 Personal history of other diseases of the circulatory system: Secondary | ICD-10-CM

## 2017-03-22 DIAGNOSIS — N951 Menopausal and female climacteric states: Secondary | ICD-10-CM | POA: Diagnosis not present

## 2017-03-22 DIAGNOSIS — Z881 Allergy status to other antibiotic agents status: Secondary | ICD-10-CM | POA: Diagnosis not present

## 2017-03-22 DIAGNOSIS — I319 Disease of pericardium, unspecified: Secondary | ICD-10-CM | POA: Diagnosis not present

## 2017-03-22 DIAGNOSIS — E042 Nontoxic multinodular goiter: Secondary | ICD-10-CM | POA: Diagnosis not present

## 2017-03-22 DIAGNOSIS — Z79899 Other long term (current) drug therapy: Secondary | ICD-10-CM | POA: Diagnosis not present

## 2017-03-22 DIAGNOSIS — E119 Type 2 diabetes mellitus without complications: Secondary | ICD-10-CM | POA: Diagnosis not present

## 2017-03-22 DIAGNOSIS — Z7984 Long term (current) use of oral hypoglycemic drugs: Secondary | ICD-10-CM | POA: Diagnosis not present

## 2017-03-22 DIAGNOSIS — Z90710 Acquired absence of both cervix and uterus: Secondary | ICD-10-CM | POA: Diagnosis not present

## 2017-03-22 HISTORY — DX: Personal history of other diseases of the circulatory system: Z86.79

## 2017-04-03 DIAGNOSIS — I313 Pericardial effusion (noninflammatory): Secondary | ICD-10-CM | POA: Diagnosis not present

## 2017-05-10 DIAGNOSIS — I313 Pericardial effusion (noninflammatory): Secondary | ICD-10-CM | POA: Diagnosis not present

## 2017-07-12 ENCOUNTER — Other Ambulatory Visit: Payer: Self-pay

## 2017-07-12 ENCOUNTER — Encounter: Payer: Self-pay | Admitting: Emergency Medicine

## 2017-07-12 ENCOUNTER — Emergency Department
Admission: EM | Admit: 2017-07-12 | Discharge: 2017-07-12 | Disposition: A | Discharge status: Home / Self Care | Payer: BLUE CROSS/BLUE SHIELD | Source: Home / Self Care | Attending: Emergency Medicine | Admitting: Emergency Medicine

## 2017-07-12 ENCOUNTER — Emergency Department (INDEPENDENT_AMBULATORY_CARE_PROVIDER_SITE_OTHER): Payer: BLUE CROSS/BLUE SHIELD

## 2017-07-12 DIAGNOSIS — Y9352 Activity, horseback riding: Secondary | ICD-10-CM | POA: Diagnosis not present

## 2017-07-12 DIAGNOSIS — S62634A Displaced fracture of distal phalanx of right ring finger, initial encounter for closed fracture: Secondary | ICD-10-CM | POA: Diagnosis not present

## 2017-07-12 DIAGNOSIS — W230XXA Caught, crushed, jammed, or pinched between moving objects, initial encounter: Secondary | ICD-10-CM

## 2017-07-12 DIAGNOSIS — S62664A Nondisplaced fracture of distal phalanx of right ring finger, initial encounter for closed fracture: Secondary | ICD-10-CM | POA: Diagnosis not present

## 2017-07-12 DIAGNOSIS — J0100 Acute maxillary sinusitis, unspecified: Secondary | ICD-10-CM

## 2017-07-12 MED ORDER — AMOXICILLIN-POT CLAVULANATE 875-125 MG PO TABS: 1.0000 | ORAL_TABLET | Freq: Two times a day (BID) | ORAL | 0 refills | Status: DC

## 2017-07-12 NOTE — ED Triage Notes (Signed)
Hurt left ring finger 4 weeks ago and still painful and reddened. Give 10 day history of sinus pressure; taking ibuprofen.

## 2017-07-12 NOTE — Discharge Instructions (Signed)
Please wear your splint. Please make your appointment with Dr. Denyse Amassorey or Dr. Karie Schwalbe. Take antibiotics as instructed and follow-up with your allergist.

## 2017-07-12 NOTE — ED Triage Notes (Signed)
Error: finger is on right hand/ring. pk

## 2017-07-12 NOTE — ED Provider Notes (Signed)
[facility]    CSN: [id number] Arrival date & time: 07/12/17  1332     History   Chief Complaint Chief Complaint  Patient presents with  . Finger Injury  . Facial Pain    HPI Ellen Morales is a 42 y.o. female.   HPI Problem #1 right ring finger injury. Patient was working with her horse an her hand became entangled on the Holter. She sustained an injury to her right ring finger. She thought it would get better but it has continued to be painful and swollen. Problem #2. Patient has a long history of allergies and sinus problems. She has had 3 sinus surgeries. She had been on allergy shots for many years but recently stopped at the advice of her allergist to see how she was doing. For the last week she has had significant sinus drainage with facial puffiness and colored yellow nasal drainage. She has not had a sinus infection for one year. Past Medical History:  Diagnosis Date  . Pre-diabetes     Patient Active Problem List   Diagnosis Date Noted  . HEMATURIA UNSPECIFIED 04/28/2009  . RENAL CALCULUS, HX OF 04/28/2009    Past Surgical History:  Procedure Laterality Date  . ABDOMINAL HYSTERECTOMY    . APPENDECTOMY    . BREAST ENHANCEMENT SURGERY    . EYE SURGERY    . FOOT SURGERY    . NASAL SINUS SURGERY      OB History    No data available       Home Medications    Prior to Admission medications   Medication Sig Start Date End Date Taking? Authorizing Provider  glipiZIDE (GLUCOTROL) 5 MG tablet Take 5 mg by mouth daily before breakfast.   Yes [provider]  amoxicillin-clavulanate (AUGMENTIN) 875-125 MG tablet Take 1 tablet by mouth 2 (two) times daily. 07/12/17   Collene Gobble, MD  cetirizine (ZYRTEC) 10 MG tablet Take 10 mg by mouth daily.    [provider]  doxycycline (VIBRAMYCIN) 100 MG capsule Take 1 capsule (100 mg total) by mouth 2 (two) times daily. One po bid x 7 days 12/01/15   Lurene Shadow, PA-C  ibuprofen  (ADVIL,MOTRIN) 800 MG tablet Take 800 mg by mouth every 8 (eight) hours as needed.    [provider]  Oxymetazoline HCl (NASAL SPRAY NA) Place into the nose.    [provider]  predniSONE (DELTASONE) 20 MG tablet 3 tabs po day one, then 2 po daily x 4 days 12/01/15   Lurene Shadow, PA-C  UNKNOWN TO PATIENT     [provider]    Family History Family History  Problem Relation Age of Onset  . Cancer Father        pancreatic  . Cancer Mother        lung/ brain tumor removed  . Diabetes Mother     Social History Social History   Tobacco Use  . Smoking status: Never Smoker  . Smokeless tobacco: Never Used  Substance Use Topics  . Alcohol use: Yes  . Drug use: No     Allergies   Biaxin [clarithromycin]; Morphine and related; and Seasonal ic [cholestatin]   Review of Systems Review of Systems  Constitutional: Negative.   HENT: Positive for congestion, facial swelling, postnasal drip, sinus pressure and sinus pain.   Eyes: Negative.   Respiratory: Negative.   Cardiovascular: Negative.   Musculoskeletal:       There is swelling  noted over the DIP joint of the right ring finger. Patient lacks 15 of full extension.     Physical Exam Triage Vital Signs ED Triage Vitals  Enc Vitals Group     BP 07/12/17 1459 114/74     Pulse Rate 07/12/17 1459 72     Resp 07/12/17 1459 16     Temp 07/12/17 1459 98.6 F (37 C)     Temp Source 07/12/17 1459 Oral     SpO2 07/12/17 1459 100 %     Weight 07/12/17 1500 165 lb (74.8 kg)     Height 07/12/17 1500 5\' 4"  (1.626 m)     Head Circumference --      Peak Flow --      Pain Score 07/12/17 1500 1     Pain Loc --      Pain Edu? --      Excl. in GC? --    No data found.  Updated Vital Signs BP 114/74 (BP Location: Right Arm)   Pulse 72   Temp 98.6 F (37 C) (Oral)   Resp 16   Ht 5\' 4"  (1.626 m)   Wt 165 lb (74.8 kg)   SpO2 100%   BMI 28.32 kg/m   Visual Acuity Right Eye Distance:   Left Eye  Distance:   Bilateral Distance:    Right Eye Near:   Left Eye Near:    Bilateral Near:     Physical Exam  Constitutional: She appears well-developed and well-nourished.  HENT:  Right Ear: External ear normal.  Left Ear: External ear normal.  There is significant nasal congestion. There is tenderness over both maxillary sinuses. Throat is clear.  Eyes: Pupils are equal, round, and reactive to light.  Pulmonary/Chest: Effort normal and breath sounds normal. No respiratory distress.  Musculoskeletal:  There is swelling present over the DIP joint of the right ring finger. Patient lacks 15 of extension.     UC Treatments / Results  Labs (all labs ordered are listed, but only abnormal results are displayed) Labs Reviewed - No data to display  EKG  EKG Interpretation None       Radiology Dg Finger Ring Right  Result Date: 07/12/2017 CLINICAL DATA:  42 year-old female was holding a halter on a horse and the horse pulled away injuring her RIGHT ring finger x 1 month ago. C/O LROM and DIP pain EXAM: RIGHT RING FINGER 2+V COMPARISON:  None. FINDINGS: There is a fracture of the base of the distal phalanx of the ring finger. Fracture extends to the articular surface. There is associated mild soft tissue swelling. IMPRESSION: Fracture of the distal phalanx. Electronically Signed   By: Norva Pavlov M.D.   On: 07/12/2017 15:00    Procedures Procedures (including critical care time)  Medications Ordered in UC Medications - No data to display   Initial Impression / Assessment and Plan / UC Course  I have reviewed the triage vital signs and the nursing notes.  Pertinent labs & imaging results that were available during my care of the patient were reviewed by me and considered in my medical decision making (see chart for details). Problem #1. Patient has a fracture of the distal phalanx of the right ring finger. She was placed in a splint. She will follow-up with Dr. Denyse Amass or Dr. Karie Schwalbe  in the clinic next door Problem #2. Patient has an acute sinusitis with underlying significant allergic disease. Will treat with Augmentin 875 twice a day. She will follow-up  with her allergist. She will continue Nettie pot twice a day and use saline gel through the day.      Final Clinical Impressions(s) / UC Diagnoses   Final diagnoses:  Acute maxillary sinusitis, recurrence not specified  Displaced fracture of distal phalanx of right ring finger, initial encounter for closed fracture    ED Discharge Orders        Ordered    amoxicillin-clavulanate (AUGMENTIN) 875-125 MG tablet  2 times daily     07/12/17 1522       Controlled Substance Prescriptions [place] Controlled Substance Registry consulted? Not Applicable   Collene Gobbleaub, Amerah Puleo A, MD 07/12/17 [phone]

## 2017-07-15 ENCOUNTER — Telehealth: Payer: Self-pay | Admitting: Emergency Medicine

## 2017-07-15 NOTE — Telephone Encounter (Signed)
Patient states both her finger and sinus pressure has improved.

## 2017-07-27 NOTE — Telephone Encounter (Signed)
Pt called has a yeast infection from antibiotic, called in diflucan 150 mg 1 po today, may repeat in 3 days if still symptomatic.

## 2017-08-07 DIAGNOSIS — R2232 Localized swelling, mass and lump, left upper limb: Secondary | ICD-10-CM | POA: Diagnosis not present

## 2017-08-07 DIAGNOSIS — R739 Hyperglycemia, unspecified: Secondary | ICD-10-CM | POA: Diagnosis not present

## 2017-08-07 DIAGNOSIS — R2242 Localized swelling, mass and lump, left lower limb: Secondary | ICD-10-CM | POA: Diagnosis not present

## 2017-08-07 DIAGNOSIS — J0101 Acute recurrent maxillary sinusitis: Secondary | ICD-10-CM | POA: Diagnosis not present

## 2017-08-14 DIAGNOSIS — J358 Other chronic diseases of tonsils and adenoids: Secondary | ICD-10-CM | POA: Diagnosis not present

## 2017-08-14 DIAGNOSIS — J3089 Other allergic rhinitis: Secondary | ICD-10-CM | POA: Diagnosis not present

## 2017-08-14 DIAGNOSIS — E042 Nontoxic multinodular goiter: Secondary | ICD-10-CM | POA: Diagnosis not present

## 2017-08-14 DIAGNOSIS — J3489 Other specified disorders of nose and nasal sinuses: Secondary | ICD-10-CM | POA: Diagnosis not present

## 2017-08-16 DIAGNOSIS — R52 Pain, unspecified: Secondary | ICD-10-CM | POA: Diagnosis not present

## 2017-08-16 DIAGNOSIS — M19079 Primary osteoarthritis, unspecified ankle and foot: Secondary | ICD-10-CM | POA: Diagnosis not present

## 2017-08-16 DIAGNOSIS — M799 Soft tissue disorder, unspecified: Secondary | ICD-10-CM | POA: Diagnosis not present

## 2017-08-16 DIAGNOSIS — M24073 Loose body in unspecified ankle: Secondary | ICD-10-CM | POA: Diagnosis not present

## 2017-08-21 DIAGNOSIS — E119 Type 2 diabetes mellitus without complications: Secondary | ICD-10-CM | POA: Diagnosis not present

## 2017-09-04 DIAGNOSIS — Z713 Dietary counseling and surveillance: Secondary | ICD-10-CM | POA: Diagnosis not present

## 2017-09-04 DIAGNOSIS — E119 Type 2 diabetes mellitus without complications: Secondary | ICD-10-CM | POA: Diagnosis not present

## 2017-09-06 DIAGNOSIS — Z0001 Encounter for general adult medical examination with abnormal findings: Secondary | ICD-10-CM | POA: Diagnosis not present

## 2017-09-06 DIAGNOSIS — R5383 Other fatigue: Secondary | ICD-10-CM | POA: Diagnosis not present

## 2017-09-06 DIAGNOSIS — E559 Vitamin D deficiency, unspecified: Secondary | ICD-10-CM | POA: Diagnosis not present

## 2017-09-06 DIAGNOSIS — I313 Pericardial effusion (noninflammatory): Secondary | ICD-10-CM | POA: Diagnosis not present

## 2017-09-06 DIAGNOSIS — E663 Overweight: Secondary | ICD-10-CM | POA: Diagnosis not present

## 2017-09-06 DIAGNOSIS — Z6828 Body mass index (BMI) 28.0-28.9, adult: Secondary | ICD-10-CM | POA: Diagnosis not present

## 2017-09-06 DIAGNOSIS — E1165 Type 2 diabetes mellitus with hyperglycemia: Secondary | ICD-10-CM | POA: Diagnosis not present

## 2017-11-08 DIAGNOSIS — Z1231 Encounter for screening mammogram for malignant neoplasm of breast: Secondary | ICD-10-CM | POA: Diagnosis not present

## 2017-11-08 DIAGNOSIS — E2839 Other primary ovarian failure: Secondary | ICD-10-CM | POA: Diagnosis not present

## 2017-11-08 DIAGNOSIS — M899 Disorder of bone, unspecified: Secondary | ICD-10-CM | POA: Diagnosis not present

## 2017-11-20 DIAGNOSIS — R922 Inconclusive mammogram: Secondary | ICD-10-CM | POA: Diagnosis not present

## 2017-11-20 DIAGNOSIS — N6310 Unspecified lump in the right breast, unspecified quadrant: Secondary | ICD-10-CM | POA: Diagnosis not present

## 2017-11-20 DIAGNOSIS — N6489 Other specified disorders of breast: Secondary | ICD-10-CM | POA: Diagnosis not present

## 2017-11-20 DIAGNOSIS — R928 Other abnormal and inconclusive findings on diagnostic imaging of breast: Secondary | ICD-10-CM | POA: Diagnosis not present

## 2017-11-21 DIAGNOSIS — J3089 Other allergic rhinitis: Secondary | ICD-10-CM | POA: Diagnosis not present

## 2017-11-21 DIAGNOSIS — J358 Other chronic diseases of tonsils and adenoids: Secondary | ICD-10-CM | POA: Diagnosis not present

## 2017-11-27 DIAGNOSIS — E119 Type 2 diabetes mellitus without complications: Secondary | ICD-10-CM | POA: Diagnosis not present

## 2018-01-10 DIAGNOSIS — J301 Allergic rhinitis due to pollen: Secondary | ICD-10-CM | POA: Diagnosis not present

## 2018-01-15 ENCOUNTER — Encounter: Payer: Self-pay | Admitting: Advanced Practice Midwife

## 2018-01-15 ENCOUNTER — Encounter: Payer: Self-pay | Admitting: *Deleted

## 2018-01-15 DIAGNOSIS — J301 Allergic rhinitis due to pollen: Secondary | ICD-10-CM | POA: Diagnosis not present

## 2018-01-18 ENCOUNTER — Encounter: Payer: Self-pay | Admitting: Advanced Practice Midwife

## 2018-01-18 ENCOUNTER — Ambulatory Visit: Payer: BLUE CROSS/BLUE SHIELD | Admitting: Advanced Practice Midwife

## 2018-01-18 VITALS — BP 108/66 | HR 70 | Resp 16 | Ht 64.0 in | Wt 144.0 lb

## 2018-01-18 DIAGNOSIS — Z01419 Encounter for gynecological examination (general) (routine) without abnormal findings: Secondary | ICD-10-CM

## 2018-01-18 DIAGNOSIS — Z113 Encounter for screening for infections with a predominantly sexual mode of transmission: Secondary | ICD-10-CM | POA: Diagnosis not present

## 2018-01-18 DIAGNOSIS — N898 Other specified noninflammatory disorders of vagina: Secondary | ICD-10-CM | POA: Diagnosis not present

## 2018-01-18 MED ORDER — FLUCONAZOLE 150 MG PO TABS: 150.0000 mg | ORAL_TABLET | ORAL | 2 refills | Status: DC | PRN

## 2018-01-18 NOTE — Progress Notes (Signed)
GYNECOLOGY ANNUAL PREVENTATIVE CARE ENCOUNTER NOTE  Subjective:   Ellen Morales is a 42 y.o. G2P2 female here for a routine annual gynecologic exam.  Current complaints: vaginal itching and discharge x 1 week. Recent ABX. Took OTC Monistat 3 w/ partial improvement. Also C/O intermittent right groin pain lasting a few days per month.   Denies abnormal vaginal bleeding, pelvic pain, problems with intercourse or other gynecologic concerns.    Gynecologic History No LMP recorded. Patient has had a hysterectomy in 2013 for AUB and dysmenorrhea. Pathology was benign. Fibroids and adenomyosis present. Cervix was removed w/ hysterectomy. Contraception: tubal ligation and hysterectomy Last Pap: 2016. Results were: normal Last mammogram: 2019. Results were: Fibrocystic breast changes  Obstetric History OB History  Gravida Para Term Preterm AB Living  2 2          SAB TAB Ectopic Multiple Live Births          2    # Outcome Date GA Lbr Len/2nd Weight Sex Delivery Anes PTL Lv  2 Para      Vag-Spont     1 Para      Vag-Spont       Past Medical History:  Diagnosis Date  . ASCUS of cervix with negative high risk HPV 09/2013  . Kidney stones   . Type 2 Diabetes     Past Surgical History:  Procedure Laterality Date  . ABDOMINAL HYSTERECTOMY    . APPENDECTOMY    . BREAST ENHANCEMENT SURGERY    . EYE SURGERY    . FOOT SURGERY    . kidney stone removal    . NASAL SINUS SURGERY    . OVARIAN CYST REMOVAL    . TUBAL LIGATION    . tummy tuck      Current Outpatient Medications on File Prior to Visit  Medication Sig Dispense Refill  . amoxicillin-clavulanate (AUGMENTIN) 875-125 MG tablet Take 1 tablet by mouth 2 (two) times daily. 20 tablet 0  . cetirizine (ZYRTEC) 10 MG tablet Take 10 mg by mouth daily.    Marland Kitchen. doxycycline (VIBRAMYCIN) 100 MG capsule Take 1 capsule (100 mg total) by mouth 2 (two) times daily. One po bid x 7 days 14 capsule 0  . glipiZIDE (GLUCOTROL) 5 MG tablet Take 5 mg  by mouth daily before breakfast.    . ibuprofen (ADVIL,MOTRIN) 800 MG tablet Take 800 mg by mouth every 8 (eight) hours as needed.    . Oxymetazoline HCl (NASAL SPRAY NA) Place into the nose.    . predniSONE (DELTASONE) 20 MG tablet 3 tabs po day one, then 2 po daily x 4 days 11 tablet 0  . UNKNOWN TO PATIENT      No current facility-administered medications on file prior to visit.     Allergies  Allergen Reactions  . Biaxin [Clarithromycin]     REACTION: GI Upset. (Has taken azithromycin without problems)  . Morphine And Related   . Seasonal Ic [Cholestatin]     Social History:  reports that she has never smoked. She has never used smokeless tobacco. She reports that she drinks alcohol. She reports that she does not use drugs.  Family History  Problem Relation Age of Onset  . Cancer Father        pancreatic  . Cancer Mother        lung/ brain tumor removed  . Diabetes Mother   . Ovarian cancer Maternal Aunt     The following portions of  the patient's history were reviewed and updated as appropriate: allergies, current medications, past family history, past medical history, past social history, past surgical history and problem list.  Review of Systems Pertinent items noted in HPI and remainder of comprehensive ROS otherwise negative.   Objective:  BP 108/66   Pulse 70   Resp 16   Ht 5\' 4"  (1.626 m)   Wt 144 lb (65.3 kg)   BMI 24.72 kg/m  CONSTITUTIONAL: Well-developed, well-nourished female in no acute distress.  HENT:  Normocephalic, atraumati. EYES: Conjunctivae normal. No scleral icterus.   SKIN: Skin is warm and dry. No rash noted. Not diaphoretic. No erythema. No pallor. MUSCULOSKELETAL: Normal range of motion. No tenderness.  No cyanosis, clubbing, or edema. NEUROLOGIC: Alert and oriented to person, place, and time. Normal muscle tone coordination. PSYCHIATRIC: Normal mood and affect. Normal behavior. Normal judgment and thought content. CARDIOVASCULAR: Normal  heart rate noted, regular rhythm RESPIRATORY: Clear to auscultation bilaterally. Effort and breath sounds normal, no problems with respiration noted. BREASTS: Symmetric in size. No masses, skin changes, nipple drainage, or lymphadenopathy. Well-healed scars around bital areolas ABDOMEN: Soft, normal bowel sounds, no distention noted.  No tenderness, rebound or guarding. Well-healed abdominoplasty scars.  PELVIC: Normal appearing external genitalia; normal appearing vaginal mucosa and cervix.  Moderate amount of creamy, white, odorless discharge noted.  Pap smear not needed.  Uterus and cervix surgically absent, no other palpable masses, no uterine or adnexal tenderness.    Assessment and Plan:  1. Vaginal discharge  - Cervicovaginal ancillary only - fluconazole (DIFLUCAN) 150 MG tablet; Take 1 tablet (150 mg total) by mouth every three (3) days as needed (for yeast infection symptoms). Can take additional dose three days later if symptoms persist  Dispense: 2 tablet; Refill: 2  2. Well female exam with routine gynecological exam  - Cervicovaginal ancillary only - fluconazole (DIFLUCAN) 150 MG tablet; Take 1 tablet (150 mg total) by mouth every three (3) days as needed (for yeast infection symptoms). Can take additional dose three days later if symptoms persist  Dispense: 2 tablet; Refill: 2  Explained that Pap smears are no longer needed. Mammogram scheduled in 2020 through PCP.  Routine preventative health maintenance measures emphasized. Please refer to After Visit Summary for other counseling recommendations.    Jaynie Collins, MD, FACOG Obstetrician & Gynecologist, [facility] for [employer], [facility]

## 2018-01-18 NOTE — Patient Instructions (Signed)
Preventive Care 40-64 Years, Female Preventive care refers to lifestyle choices and visits with your health care provider that can promote health and wellness. What does preventive care include?  A yearly physical exam. This is also called an annual well check.  Dental exams once or twice a year.  Routine eye exams. Ask your health care provider how often you should have your eyes checked.  Personal lifestyle choices, including: ? Daily care of your teeth and gums. ? Regular physical activity. ? Eating a healthy diet. ? Avoiding tobacco and drug use. ? Limiting alcohol use. ? Practicing safe sex. ? Taking low-dose aspirin daily starting at age 58. ? Taking vitamin and mineral supplements as recommended by your health care provider. What happens during an annual well check? The services and screenings done by your health care provider during your annual well check will depend on your age, overall health, lifestyle risk factors, and family history of disease. Counseling Your health care provider may ask you questions about your:  Alcohol use.  Tobacco use.  Drug use.  Emotional well-being.  Home and relationship well-being.  Sexual activity.  Eating habits.  Work and work Statistician.  Method of birth control.  Menstrual cycle.  Pregnancy history.  Screening You may have the following tests or measurements:  Height, weight, and BMI.  Blood pressure.  Lipid and cholesterol levels. These may be checked every 5 years, or more frequently if you are over 81 years old.  Skin check.  Lung cancer screening. You may have this screening every year starting at age 78 if you have a 30-pack-year history of smoking and currently smoke or have quit within the past 15 years.  Fecal occult blood test (FOBT) of the stool. You may have this test every year starting at age 65.  Flexible sigmoidoscopy or colonoscopy. You may have a sigmoidoscopy every 5 years or a colonoscopy  every 10 years starting at age 30.  Hepatitis C blood test.  Hepatitis B blood test.  Sexually transmitted disease (STD) testing.  Diabetes screening. This is done by checking your blood sugar (glucose) after you have not eaten for a while (fasting). You may have this done every 1-3 years.  Mammogram. This may be done every 1-2 years. Talk to your health care provider about when you should start having regular mammograms. This may depend on whether you have a family history of breast cancer.  BRCA-related cancer screening. This may be done if you have a family history of breast, ovarian, tubal, or peritoneal cancers.  Pelvic exam and Pap test. This may be done every 3 years starting at age 80. Starting at age 36, this may be done every 5 years if you have a Pap test in combination with an HPV test.  Bone density scan. This is done to screen for osteoporosis. You may have this scan if you are at high risk for osteoporosis.  Discuss your test results, treatment options, and if necessary, the need for more tests with your health care provider. Vaccines Your health care provider may recommend certain vaccines, such as:  Influenza vaccine. This is recommended every year.  Tetanus, diphtheria, and acellular pertussis (Tdap, Td) vaccine. You may need a Td booster every 10 years.  Varicella vaccine. You may need this if you have not been vaccinated.  Zoster vaccine. You may need this after age 5.  Measles, mumps, and rubella (MMR) vaccine. You may need at least one dose of MMR if you were born in  1956 or later. You may also need a second dose.  Pneumococcal 13-valent conjugate (PCV13) vaccine. You may need this if you have certain conditions and were not previously vaccinated.  Pneumococcal polysaccharide (PPSV23) vaccine. You may need one or two doses if you smoke cigarettes or if you have certain conditions.  Meningococcal vaccine. You may need this if you have certain  conditions.  Hepatitis A vaccine. You may need this if you have certain conditions or if you travel or work in places where you may be exposed to hepatitis A.  Hepatitis B vaccine. You may need this if you have certain conditions or if you travel or work in places where you may be exposed to hepatitis B.  Haemophilus influenzae type b (Hib) vaccine. You may need this if you have certain conditions.  Talk to your health care provider about which screenings and vaccines you need and how often you need them. This information is not intended to replace advice given to you by your health care provider. Make sure you discuss any questions you have with your health care provider. Document Released: 06/25/2015 Document Revised: 02/16/2016 Document Reviewed: 03/30/2015 Elsevier Interactive Patient Education  2018 Elsevier Inc.  

## 2018-01-21 LAB — CERVICOVAGINAL ANCILLARY ONLY
BACTERIAL VAGINITIS: NEGATIVE
CHLAMYDIA, DNA PROBE: NEGATIVE
Candida vaginitis: NEGATIVE
NEISSERIA GONORRHEA: NEGATIVE
TRICH (WINDOWPATH): NEGATIVE

## 2018-04-02 DIAGNOSIS — Z23 Encounter for immunization: Secondary | ICD-10-CM | POA: Diagnosis not present

## 2018-04-02 DIAGNOSIS — E119 Type 2 diabetes mellitus without complications: Secondary | ICD-10-CM | POA: Diagnosis not present

## 2018-06-25 DIAGNOSIS — E119 Type 2 diabetes mellitus without complications: Secondary | ICD-10-CM | POA: Diagnosis not present

## 2018-09-13 DIAGNOSIS — E559 Vitamin D deficiency, unspecified: Secondary | ICD-10-CM | POA: Diagnosis not present

## 2018-09-13 DIAGNOSIS — Z1389 Encounter for screening for other disorder: Secondary | ICD-10-CM | POA: Diagnosis not present

## 2018-09-13 DIAGNOSIS — R002 Palpitations: Secondary | ICD-10-CM | POA: Diagnosis not present

## 2018-09-13 DIAGNOSIS — Z0000 Encounter for general adult medical examination without abnormal findings: Secondary | ICD-10-CM | POA: Diagnosis not present

## 2018-09-13 DIAGNOSIS — I313 Pericardial effusion (noninflammatory): Secondary | ICD-10-CM | POA: Diagnosis not present

## 2018-09-13 DIAGNOSIS — Z0001 Encounter for general adult medical examination with abnormal findings: Secondary | ICD-10-CM | POA: Diagnosis not present

## 2018-09-13 DIAGNOSIS — E663 Overweight: Secondary | ICD-10-CM | POA: Diagnosis not present

## 2018-09-13 DIAGNOSIS — G43909 Migraine, unspecified, not intractable, without status migrainosus: Secondary | ICD-10-CM | POA: Diagnosis not present

## 2018-09-13 DIAGNOSIS — E079 Disorder of thyroid, unspecified: Secondary | ICD-10-CM | POA: Diagnosis not present

## 2018-09-13 DIAGNOSIS — N200 Calculus of kidney: Secondary | ICD-10-CM | POA: Diagnosis not present

## 2018-09-13 DIAGNOSIS — Z6827 Body mass index (BMI) 27.0-27.9, adult: Secondary | ICD-10-CM | POA: Diagnosis not present

## 2018-11-22 DIAGNOSIS — E042 Nontoxic multinodular goiter: Secondary | ICD-10-CM | POA: Diagnosis not present

## 2018-11-22 DIAGNOSIS — J3089 Other allergic rhinitis: Secondary | ICD-10-CM | POA: Diagnosis not present

## 2018-12-25 DIAGNOSIS — R252 Cramp and spasm: Secondary | ICD-10-CM | POA: Diagnosis not present

## 2018-12-25 DIAGNOSIS — S161XXA Strain of muscle, fascia and tendon at neck level, initial encounter: Secondary | ICD-10-CM | POA: Diagnosis not present

## 2019-02-28 ENCOUNTER — Encounter: Payer: Self-pay | Admitting: Emergency Medicine

## 2019-02-28 ENCOUNTER — Emergency Department
Admission: EM | Admit: 2019-02-28 | Discharge: 2019-02-28 | Disposition: A | Discharge status: Home / Self Care | Payer: BLUE CROSS/BLUE SHIELD | Source: Home / Self Care

## 2019-02-28 ENCOUNTER — Other Ambulatory Visit: Payer: Self-pay

## 2019-02-28 DIAGNOSIS — R739 Hyperglycemia, unspecified: Secondary | ICD-10-CM | POA: Diagnosis not present

## 2019-02-28 DIAGNOSIS — R3129 Other microscopic hematuria: Secondary | ICD-10-CM | POA: Diagnosis not present

## 2019-02-28 DIAGNOSIS — R350 Frequency of micturition: Secondary | ICD-10-CM | POA: Diagnosis not present

## 2019-02-28 LAB — POCT URINALYSIS DIP (MANUAL ENTRY)
Bilirubin, UA: NEGATIVE
Glucose, UA: NEGATIVE mg/dL
Ketones, POC UA: NEGATIVE mg/dL
Leukocytes, UA: NEGATIVE
Nitrite, UA: NEGATIVE
Protein Ur, POC: NEGATIVE mg/dL
Spec Grav, UA: >=1.03 — AB (ref 1.010–1.025)
Urobilinogen, UA: 0.2 E.U./dL
pH, UA: 5 (ref 5.0–8.0)

## 2019-02-28 NOTE — ED Triage Notes (Signed)
Patient began experiencing dysuria last night; no OTC except ibuprofen 800mg  po this a.m.  She has not travelled past 4 weeks.

## 2019-02-28 NOTE — ED Provider Notes (Signed)
[facility]    CSN: [id number] Arrival date & time: 02/28/19  1531      History   Chief Complaint Chief Complaint  Patient presents with  . Dysuria    HPI Ellen Morales is a 43 y.o. female.   HPI Ellen Morales is a 43 y.o. female presenting to UC with c/o sudden onset dysuria and urinary frequency that started last night, mild bladder discomfort. Pt concerned she may have a UTI. Symptoms did improve slightly this morning after drinking more water and taking 800mg  ibuprofen. She does report hx of Type 2 diabetes, she was taken off medication about 6 months ago due to diet and exercise improving her blood sugar but admits to gaining about 10 pounds since Covid-19 started and she had a diet soda about 2 days ago, which has triggered UTIs in the past. Denies fever, chills, n/v/d. She has f/u with her PCP in about 2 weeks but had been putting it off since Covid.   Past Medical History:  Diagnosis Date  . ASCUS of cervix with negative high risk HPV 09/2013  . Endometriosis   . Kidney stones   . Type 2 diabetes mellitus treated with insulin [facility](HCC)     Patient Active Problem List   Diagnosis Date Noted  . HEMATURIA UNSPECIFIED 04/28/2009  . RENAL CALCULUS, HX OF 04/28/2009    Past Surgical History:  Procedure Laterality Date  . ABDOMINAL HYSTERECTOMY    . APPENDECTOMY    . BREAST ENHANCEMENT SURGERY    . EYE SURGERY    . FOOT SURGERY    . kidney stone removal    . NASAL SINUS SURGERY    . OVARIAN CYST REMOVAL    . TUBAL LIGATION    . tummy tuck      OB History    Gravida  2   Para  2   Term      Preterm      AB      Living        SAB      TAB      Ectopic      Multiple      Live Births  2            Home Medications    Prior to Admission medications   Medication Sig Start Date End Date Taking? Authorizing Provider  fluconazole (DIFLUCAN) 150 MG tablet Take 1 tablet (150 mg total) by mouth every three (3) days as needed (for  yeast infection symptoms). Can take additional dose three days later if symptoms persist 01/18/18   Dorathy KinsmanSmith, [place], CNM    Family History Family History  Problem Relation Age of Onset  . Cancer Father        pancreatic  . Cancer Mother        lung/ brain tumor removed  . Diabetes Mother   . Ovarian cancer Maternal Aunt     Social History Social History   Tobacco Use  . Smoking status: Never Smoker  . Smokeless tobacco: Never Used  Substance Use Topics  . Alcohol use: Yes  . Drug use: No     Allergies   Biaxin [clarithromycin], Morphine and related, and Seasonal ic [cholestatin]   Review of Systems Review of Systems  Constitutional: Negative for chills and fever.  HENT: Negative for congestion, ear pain, sore throat, trouble swallowing and voice change.   Respiratory: Negative for cough and shortness of breath.   Cardiovascular: Negative for  chest pain and palpitations.  Gastrointestinal: Negative for abdominal pain, diarrhea, nausea and vomiting.  Genitourinary: Positive for decreased urine volume, dysuria, frequency, pelvic pain (pressure) and urgency. Negative for flank pain, hematuria, vaginal bleeding, vaginal discharge and vaginal pain.  Musculoskeletal: Negative for arthralgias, back pain and myalgias.  Skin: Negative for rash.     Physical Exam Triage Vital Signs ED Triage Vitals  Enc Vitals Group     BP 02/28/19 1607 117/72     Pulse Rate 02/28/19 1607 87     Resp 02/28/19 1607 16     Temp 02/28/19 1607 98 F (36.7 C)     Temp Source 02/28/19 1607 Oral     SpO2 02/28/19 1607 98 %     Weight 02/28/19 1611 157 lb (71.2 kg)     Height 02/28/19 1611 5\' 4"  (1.626 m)     Head Circumference --      Peak Flow --      Pain Score 02/28/19 1611 2     Pain Loc --      Pain Edu? --      Excl. in [place]? --    No data found.  Updated Vital Signs BP 117/72 (BP Location: Right Arm)   Pulse 87   Temp 98 F (36.7 C) (Oral)   Resp 16   Ht 5\' 4"  (1.626 m)   Wt  157 lb (71.2 kg)   SpO2 98%   BMI 26.95 kg/m   Visual Acuity Right Eye Distance:   Left Eye Distance:   Bilateral Distance:    Right Eye Near:   Left Eye Near:    Bilateral Near:     Physical Exam Vitals signs and nursing note reviewed.  Constitutional:      Appearance: Normal appearance. She is well-developed.  HENT:     Head: Normocephalic and atraumatic.     Nose: Nose normal.     Mouth/Throat:     Mouth: Mucous membranes are moist.  Neck:     Musculoskeletal: Normal range of motion.  Cardiovascular:     Rate and Rhythm: Normal rate and regular rhythm.  Pulmonary:     Effort: Pulmonary effort is normal. No respiratory distress.     Breath sounds: Normal breath sounds.  Abdominal:     General: There is no distension.     Palpations: Abdomen is soft.     Tenderness: There is no abdominal tenderness. There is no right CVA tenderness or left CVA tenderness.  Musculoskeletal: Normal range of motion.  Skin:    General: Skin is warm and dry.  Neurological:     Mental Status: She is alert and oriented to person, place, and time.  Psychiatric:        Behavior: Behavior normal.      UC Treatments / Results  Labs (all labs ordered are listed, but only abnormal results are displayed) Labs Reviewed  COMPLETE METABOLIC PANEL WITH GFR - Abnormal; Notable for the following components:      Result Value   Glucose, Bld 134 (*)    All other components within normal limits  POCT URINALYSIS DIP (MANUAL ENTRY) - Abnormal; Notable for the following components:   Spec Grav, UA >=1.030 (*)    Blood, UA small (*)    All other components within normal limits  URINE CULTURE  HEMOGLOBIN A1C  POCT FASTING CBG [place]    EKG   Radiology No results found.  Procedures Procedures (including critical care time)  Medications  Ordered in UC Medications - No data to display  Initial Impression / Assessment and Plan / UC Course  I have reviewed the triage vital signs  and the nursing notes.  Pertinent labs & imaging results that were available during my care of the patient were reviewed by me and considered in my medical decision making (see chart for details).    UA unremarkable CBG checked: elevated at 134, as pt has not eaten anything since earlier this morning. HgbA1c sent to lab Encouraged f/u with PCP  AVS provided  Final Clinical Impressions(s) / UC Diagnoses   Final diagnoses:  Urinary frequency  Hyperglycemia  Other microscopic hematuria     Discharge Instructions      Please follow up with your family doctor for ongoing healthcare needs including management of your blood sugar.   If your labs, HgbA1c, is normal, you will likely not get a call back, however, if it is abnormal, you should be notified in about 2-3 days.   Call 911 or go to the hospital if you develop worsening symptoms of high blood sugar- severe headache, blurry vision, vomiting, abdominal pain, or other new concerning symptoms develop.     ED Prescriptions    None     PDMP not reviewed this encounter.   Lurene Shadow, [place] 03/01/19 1115

## 2019-02-28 NOTE — Discharge Instructions (Signed)
°  Please follow up with your family doctor for ongoing healthcare needs including management of your blood sugar.   If your labs, HgbA1c, is normal, you will likely not get a call back, however, if it is abnormal, you should be notified in about 2-3 days.   Call 911 or go to the hospital if you develop worsening symptoms of high blood sugar- severe headache, blurry vision, vomiting, abdominal pain, or other new concerning symptoms develop.

## 2019-03-02 LAB — URINE CULTURE
MICRO NUMBER:: 899634
Result:: NO GROWTH
SPECIMEN QUALITY:: ADEQUATE

## 2019-03-03 LAB — COMPLETE METABOLIC PANEL WITH GFR
AG Ratio: 1.8 (calc) (ref 1.0–2.5)
ALT: 22 U/L (ref 6–29)
AST: 17 U/L (ref 10–30)
Albumin: 4.4 g/dL (ref 3.6–5.1)
Alkaline phosphatase (APISO): 74 U/L (ref 31–125)
BUN: 12 mg/dL (ref 7–25)
CO2: 25 mmol/L (ref 20–32)
Calcium: 9.7 mg/dL (ref 8.6–10.2)
Chloride: 103 mmol/L (ref 98–110)
Creat: 0.71 mg/dL (ref 0.50–1.10)
GFR, Est African American: 122 mL/min/{1.73_m2} (ref >=60)
GFR, Est Non African American: 105 mL/min/{1.73_m2} (ref >=60)
Globulin: 2.5 g/dL (calc) (ref 1.9–3.7)
Glucose, Bld: 134 mg/dL — ABNORMAL HIGH (ref 65–99)
Potassium: 4.6 mmol/L (ref 3.5–5.3)
Sodium: 140 mmol/L (ref 135–146)
Total Bilirubin: 0.3 mg/dL (ref 0.2–1.2)
Total Protein: 6.9 g/dL (ref 6.1–8.1)

## 2019-03-03 LAB — HEMOGLOBIN A1C

## 2019-03-04 ENCOUNTER — Telehealth: Payer: Self-pay

## 2019-03-04 LAB — POCT FASTING CBG KUC MANUAL ENTRY: POCT Glucose (KUC): 155 mg/dL — AB (ref 70–99)

## 2019-03-04 NOTE — Telephone Encounter (Signed)
Left message on VM to follow up with PCP in 2 weeks.  Left contact information for any questions or concerns.

## 2019-03-05 ENCOUNTER — Telehealth (HOSPITAL_COMMUNITY): Payer: Self-pay | Admitting: Emergency Medicine

## 2019-03-05 NOTE — Telephone Encounter (Signed)
Attempted to reach patient. No answer at this time. Voicemail left.    

## 2019-03-06 ENCOUNTER — Telehealth (HOSPITAL_COMMUNITY): Payer: Self-pay | Admitting: Emergency Medicine

## 2019-03-06 NOTE — Telephone Encounter (Signed)
Contacted patient about missing labs, pt decided to follow up with her PCP for recollection.

## 2019-03-11 DIAGNOSIS — J358 Other chronic diseases of tonsils and adenoids: Secondary | ICD-10-CM | POA: Diagnosis not present

## 2019-03-11 DIAGNOSIS — J329 Chronic sinusitis, unspecified: Secondary | ICD-10-CM | POA: Diagnosis not present

## 2019-03-11 DIAGNOSIS — B49 Unspecified mycosis: Secondary | ICD-10-CM | POA: Diagnosis not present

## 2019-03-11 DIAGNOSIS — J3489 Other specified disorders of nose and nasal sinuses: Secondary | ICD-10-CM | POA: Diagnosis not present

## 2019-03-11 DIAGNOSIS — E042 Nontoxic multinodular goiter: Secondary | ICD-10-CM | POA: Diagnosis not present

## 2019-03-11 DIAGNOSIS — J301 Allergic rhinitis due to pollen: Secondary | ICD-10-CM | POA: Diagnosis not present

## 2019-04-03 DIAGNOSIS — N200 Calculus of kidney: Secondary | ICD-10-CM | POA: Diagnosis not present

## 2019-04-03 DIAGNOSIS — R1084 Generalized abdominal pain: Secondary | ICD-10-CM | POA: Diagnosis not present

## 2019-04-09 DIAGNOSIS — E119 Type 2 diabetes mellitus without complications: Secondary | ICD-10-CM | POA: Diagnosis not present

## 2019-04-15 DIAGNOSIS — G8929 Other chronic pain: Secondary | ICD-10-CM | POA: Diagnosis not present

## 2019-04-15 DIAGNOSIS — M25511 Pain in right shoulder: Secondary | ICD-10-CM | POA: Diagnosis not present

## 2019-04-15 DIAGNOSIS — M25512 Pain in left shoulder: Secondary | ICD-10-CM | POA: Diagnosis not present

## 2019-04-15 DIAGNOSIS — M778 Other enthesopathies, not elsewhere classified: Secondary | ICD-10-CM | POA: Diagnosis not present

## 2019-04-29 DIAGNOSIS — M25512 Pain in left shoulder: Secondary | ICD-10-CM | POA: Diagnosis not present

## 2019-04-29 DIAGNOSIS — G8929 Other chronic pain: Secondary | ICD-10-CM | POA: Diagnosis not present

## 2019-04-29 DIAGNOSIS — M25511 Pain in right shoulder: Secondary | ICD-10-CM | POA: Diagnosis not present

## 2019-04-29 DIAGNOSIS — M778 Other enthesopathies, not elsewhere classified: Secondary | ICD-10-CM | POA: Diagnosis not present

## 2019-05-06 DIAGNOSIS — M778 Other enthesopathies, not elsewhere classified: Secondary | ICD-10-CM | POA: Diagnosis not present

## 2019-05-06 DIAGNOSIS — M546 Pain in thoracic spine: Secondary | ICD-10-CM | POA: Diagnosis not present

## 2019-05-19 DIAGNOSIS — N342 Other urethritis: Secondary | ICD-10-CM | POA: Diagnosis not present

## 2019-05-20 DIAGNOSIS — M79632 Pain in left forearm: Secondary | ICD-10-CM | POA: Diagnosis not present

## 2019-05-20 DIAGNOSIS — M778 Other enthesopathies, not elsewhere classified: Secondary | ICD-10-CM | POA: Diagnosis not present

## 2019-05-20 DIAGNOSIS — M542 Cervicalgia: Secondary | ICD-10-CM | POA: Diagnosis not present

## 2019-05-20 DIAGNOSIS — M79631 Pain in right forearm: Secondary | ICD-10-CM | POA: Diagnosis not present

## 2019-05-27 DIAGNOSIS — M778 Other enthesopathies, not elsewhere classified: Secondary | ICD-10-CM | POA: Diagnosis not present

## 2019-05-27 DIAGNOSIS — M79632 Pain in left forearm: Secondary | ICD-10-CM | POA: Diagnosis not present

## 2019-05-27 DIAGNOSIS — M79631 Pain in right forearm: Secondary | ICD-10-CM | POA: Diagnosis not present

## 2019-05-27 DIAGNOSIS — M542 Cervicalgia: Secondary | ICD-10-CM | POA: Diagnosis not present

## 2019-05-30 DIAGNOSIS — M25512 Pain in left shoulder: Secondary | ICD-10-CM | POA: Diagnosis not present

## 2019-05-30 DIAGNOSIS — G8929 Other chronic pain: Secondary | ICD-10-CM | POA: Diagnosis not present

## 2019-05-30 DIAGNOSIS — M25511 Pain in right shoulder: Secondary | ICD-10-CM | POA: Diagnosis not present

## 2019-05-30 DIAGNOSIS — J358 Other chronic diseases of tonsils and adenoids: Secondary | ICD-10-CM | POA: Diagnosis not present

## 2019-05-30 DIAGNOSIS — B49 Unspecified mycosis: Secondary | ICD-10-CM | POA: Diagnosis not present

## 2019-05-30 DIAGNOSIS — M778 Other enthesopathies, not elsewhere classified: Secondary | ICD-10-CM | POA: Diagnosis not present

## 2019-06-02 DIAGNOSIS — Z1159 Encounter for screening for other viral diseases: Secondary | ICD-10-CM | POA: Diagnosis not present

## 2019-06-04 DIAGNOSIS — J3501 Chronic tonsillitis: Secondary | ICD-10-CM | POA: Diagnosis not present

## 2019-06-04 DIAGNOSIS — J358 Other chronic diseases of tonsils and adenoids: Secondary | ICD-10-CM | POA: Diagnosis not present

## 2019-06-04 DIAGNOSIS — B49 Unspecified mycosis: Secondary | ICD-10-CM | POA: Diagnosis not present

## 2019-06-04 DIAGNOSIS — J301 Allergic rhinitis due to pollen: Secondary | ICD-10-CM | POA: Diagnosis not present

## 2019-06-04 DIAGNOSIS — J351 Hypertrophy of tonsils: Secondary | ICD-10-CM | POA: Diagnosis not present

## 2019-06-04 DIAGNOSIS — R930 Abnormal findings on diagnostic imaging of skull and head, not elsewhere classified: Secondary | ICD-10-CM | POA: Diagnosis not present

## 2019-07-04 DIAGNOSIS — M546 Pain in thoracic spine: Secondary | ICD-10-CM | POA: Diagnosis not present

## 2019-07-04 DIAGNOSIS — M778 Other enthesopathies, not elsewhere classified: Secondary | ICD-10-CM | POA: Diagnosis not present

## 2019-07-17 DIAGNOSIS — K219 Gastro-esophageal reflux disease without esophagitis: Secondary | ICD-10-CM | POA: Diagnosis not present

## 2019-08-28 DIAGNOSIS — R0602 Shortness of breath: Secondary | ICD-10-CM | POA: Diagnosis not present

## 2019-08-28 DIAGNOSIS — K219 Gastro-esophageal reflux disease without esophagitis: Secondary | ICD-10-CM | POA: Diagnosis not present

## 2019-08-28 DIAGNOSIS — J329 Chronic sinusitis, unspecified: Secondary | ICD-10-CM | POA: Diagnosis not present

## 2019-09-16 DIAGNOSIS — M542 Cervicalgia: Secondary | ICD-10-CM | POA: Diagnosis not present

## 2019-09-16 DIAGNOSIS — M79602 Pain in left arm: Secondary | ICD-10-CM | POA: Diagnosis not present

## 2019-09-23 DIAGNOSIS — M542 Cervicalgia: Secondary | ICD-10-CM | POA: Diagnosis not present

## 2019-09-23 DIAGNOSIS — M79602 Pain in left arm: Secondary | ICD-10-CM | POA: Diagnosis not present

## 2019-09-23 DIAGNOSIS — K219 Gastro-esophageal reflux disease without esophagitis: Secondary | ICD-10-CM | POA: Diagnosis not present

## 2019-09-23 DIAGNOSIS — R0602 Shortness of breath: Secondary | ICD-10-CM | POA: Diagnosis not present

## 2019-09-23 DIAGNOSIS — J329 Chronic sinusitis, unspecified: Secondary | ICD-10-CM | POA: Diagnosis not present

## 2019-09-25 DIAGNOSIS — M79602 Pain in left arm: Secondary | ICD-10-CM | POA: Diagnosis not present

## 2019-09-25 DIAGNOSIS — M542 Cervicalgia: Secondary | ICD-10-CM | POA: Diagnosis not present

## 2019-10-02 DIAGNOSIS — Z119 Encounter for screening for infectious and parasitic diseases, unspecified: Secondary | ICD-10-CM | POA: Diagnosis not present

## 2019-10-02 DIAGNOSIS — E663 Overweight: Secondary | ICD-10-CM | POA: Diagnosis not present

## 2019-10-02 DIAGNOSIS — M7711 Lateral epicondylitis, right elbow: Secondary | ICD-10-CM | POA: Diagnosis not present

## 2019-10-02 DIAGNOSIS — Z6828 Body mass index (BMI) 28.0-28.9, adult: Secondary | ICD-10-CM | POA: Diagnosis not present

## 2019-10-02 DIAGNOSIS — M7712 Lateral epicondylitis, left elbow: Secondary | ICD-10-CM | POA: Diagnosis not present

## 2019-10-07 DIAGNOSIS — M79602 Pain in left arm: Secondary | ICD-10-CM | POA: Diagnosis not present

## 2019-10-07 DIAGNOSIS — M542 Cervicalgia: Secondary | ICD-10-CM | POA: Diagnosis not present

## 2019-10-20 DIAGNOSIS — M542 Cervicalgia: Secondary | ICD-10-CM | POA: Diagnosis not present

## 2019-10-20 DIAGNOSIS — M79602 Pain in left arm: Secondary | ICD-10-CM | POA: Diagnosis not present

## 2019-10-22 DIAGNOSIS — M79602 Pain in left arm: Secondary | ICD-10-CM | POA: Diagnosis not present

## 2019-10-22 DIAGNOSIS — M542 Cervicalgia: Secondary | ICD-10-CM | POA: Diagnosis not present

## 2019-11-13 DIAGNOSIS — M79602 Pain in left arm: Secondary | ICD-10-CM | POA: Diagnosis not present

## 2019-11-13 DIAGNOSIS — M542 Cervicalgia: Secondary | ICD-10-CM | POA: Diagnosis not present

## 2020-01-02 DIAGNOSIS — I313 Pericardial effusion (noninflammatory): Secondary | ICD-10-CM | POA: Diagnosis not present

## 2020-01-02 DIAGNOSIS — E559 Vitamin D deficiency, unspecified: Secondary | ICD-10-CM | POA: Diagnosis not present

## 2020-01-02 DIAGNOSIS — E7849 Other hyperlipidemia: Secondary | ICD-10-CM | POA: Diagnosis not present

## 2020-01-02 DIAGNOSIS — N6310 Unspecified lump in the right breast, unspecified quadrant: Secondary | ICD-10-CM | POA: Diagnosis not present

## 2020-01-02 DIAGNOSIS — R5383 Other fatigue: Secondary | ICD-10-CM | POA: Diagnosis not present

## 2020-01-02 DIAGNOSIS — E079 Disorder of thyroid, unspecified: Secondary | ICD-10-CM | POA: Diagnosis not present

## 2020-01-02 DIAGNOSIS — R7309 Other abnormal glucose: Secondary | ICD-10-CM | POA: Diagnosis not present

## 2020-01-02 DIAGNOSIS — Z0001 Encounter for general adult medical examination with abnormal findings: Secondary | ICD-10-CM | POA: Diagnosis not present

## 2020-01-02 DIAGNOSIS — G43909 Migraine, unspecified, not intractable, without status migrainosus: Secondary | ICD-10-CM | POA: Diagnosis not present

## 2020-01-02 DIAGNOSIS — Z6827 Body mass index (BMI) 27.0-27.9, adult: Secondary | ICD-10-CM | POA: Diagnosis not present

## 2020-01-09 DIAGNOSIS — N6312 Unspecified lump in the right breast, upper inner quadrant: Secondary | ICD-10-CM | POA: Diagnosis not present

## 2020-01-09 DIAGNOSIS — R928 Other abnormal and inconclusive findings on diagnostic imaging of breast: Secondary | ICD-10-CM | POA: Diagnosis not present

## 2020-01-09 DIAGNOSIS — N6311 Unspecified lump in the right breast, upper outer quadrant: Secondary | ICD-10-CM | POA: Diagnosis not present

## 2020-01-09 DIAGNOSIS — Z9882 Breast implant status: Secondary | ICD-10-CM | POA: Diagnosis not present

## 2020-02-03 DIAGNOSIS — Z9882 Breast implant status: Secondary | ICD-10-CM | POA: Diagnosis not present

## 2020-02-03 DIAGNOSIS — R928 Other abnormal and inconclusive findings on diagnostic imaging of breast: Secondary | ICD-10-CM | POA: Diagnosis not present

## 2020-04-09 DIAGNOSIS — M5412 Radiculopathy, cervical region: Secondary | ICD-10-CM | POA: Diagnosis not present

## 2020-04-09 DIAGNOSIS — M25512 Pain in left shoulder: Secondary | ICD-10-CM | POA: Diagnosis not present

## 2020-04-09 DIAGNOSIS — S4992XA Unspecified injury of left shoulder and upper arm, initial encounter: Secondary | ICD-10-CM | POA: Diagnosis not present

## 2020-07-27 DIAGNOSIS — M7711 Lateral epicondylitis, right elbow: Secondary | ICD-10-CM | POA: Diagnosis not present

## 2020-07-27 DIAGNOSIS — M7712 Lateral epicondylitis, left elbow: Secondary | ICD-10-CM | POA: Diagnosis not present

## 2020-12-17 ENCOUNTER — Emergency Department
Admission: EM | Admit: 2020-12-17 | Discharge: 2020-12-17 | Disposition: A | Discharge status: Home / Self Care | Payer: 59 | Source: Home / Self Care | Attending: Family Medicine | Admitting: Family Medicine

## 2020-12-17 ENCOUNTER — Encounter: Payer: Self-pay | Admitting: Emergency Medicine

## 2020-12-17 ENCOUNTER — Other Ambulatory Visit: Payer: Self-pay

## 2020-12-17 DIAGNOSIS — H9201 Otalgia, right ear: Secondary | ICD-10-CM | POA: Diagnosis not present

## 2020-12-17 DIAGNOSIS — Z711 Person with feared health complaint in whom no diagnosis is made: Secondary | ICD-10-CM | POA: Diagnosis not present

## 2020-12-17 NOTE — ED Provider Notes (Signed)
[facility]    CSN: [id number] Arrival date & time: 12/17/20  1616      History   Chief Complaint Chief Complaint  Patient presents with   Otalgia    HPI Ellen Morales is a 45 y.o. female.   Patient complains of mild right earache/pressure and mild sinus congestion for about four days.  She denies sore throat but has noticed several painless red "splotches" on the roof of her mouth.  She feels well otherwise.  The history is provided by the patient.   Past Medical History:  Diagnosis Date   ASCUS of cervix with negative high risk HPV 09/2013   Endometriosis    Kidney stones    Type 2 diabetes mellitus treated with insulin [facility])     Patient Active Problem List   Diagnosis Date Noted   HEMATURIA UNSPECIFIED 04/28/2009   RENAL CALCULUS, HX OF 04/28/2009    Past Surgical History:  Procedure Laterality Date   ABDOMINAL HYSTERECTOMY     APPENDECTOMY     BREAST ENHANCEMENT SURGERY     EYE SURGERY     FOOT SURGERY     kidney stone removal     NASAL SINUS SURGERY     OVARIAN CYST REMOVAL     TUBAL LIGATION     tummy tuck      OB History     Gravida  2   Para  2   Term      Preterm      AB      Living         SAB      IAB      Ectopic      Multiple      Live Births  2            Home Medications    Prior to Admission medications   Medication Sig Start Date End Date Taking? Authorizing Provider  fluconazole (DIFLUCAN) 150 MG tablet Take 1 tablet (150 mg total) by mouth every three (3) days as needed (for yeast infection symptoms). Can take additional dose three days later if symptoms persist 01/18/18   Katrinka Blazing [place], CNM    Family History Family History  Problem Relation Age of Onset   Cancer Father        pancreatic   Cancer Mother        lung/ brain tumor removed   Diabetes Mother    Ovarian cancer Maternal Aunt     Social History Social History   Tobacco Use   Smoking status: Never   Smokeless tobacco: Never   Substance Use Topics   Alcohol use: Yes   Drug use: No     Allergies   Biaxin [clarithromycin], Morphine and related, and Seasonal ic [cholestatin]   Review of Systems Review of Systems No sore throat but has notice several small red spots No cough No pleuritic pain No wheezing No nasal congestion No post-nasal drainage No sinus pain/pressure No itchy/red eyes ? Right earache No hemoptysis No SOB No fever/chills No nausea No vomiting No abdominal pain No diarrhea No urinary symptoms No skin rash No fatigue No myalgias No headache    Physical Exam Triage Vital Signs ED Triage Vitals  Enc Vitals Group     BP 12/17/20 1723 118/79     Pulse Rate 12/17/20 1723 79     Resp --      Temp 12/17/20 1723 98.8 F (37.1 C)     Temp  Source 12/17/20 1723 Oral     SpO2 12/17/20 1723 100 %     Weight 12/17/20 1725 158 lb (71.7 kg)     Height 12/17/20 1725 5\' 4"  (1.626 m)     Head Circumference --      Peak Flow --      Pain Score 12/17/20 1725 2     Pain Loc --      Pain Edu? --      Excl. in [place]? --    No data found.  Updated Vital Signs BP 118/79 (BP Location: Left Arm)   Pulse 79   Temp 98.8 F (37.1 C) (Oral)   Ht 5\' 4"  (1.626 m)   Wt 71.7 kg   SpO2 100%   BMI 27.12 kg/m   Visual Acuity Right Eye Distance:   Left Eye Distance:   Bilateral Distance:    Right Eye Near:   Left Eye Near:    Bilateral Near:     Physical Exam Nursing notes and Vital Signs reviewed. Appearance:  Patient appears stated age, and in no acute distress Eyes:  Pupils are equal, round, and reactive to light and accomodation.  Extraocular movement is intact.  Conjunctivae are not inflamed  Ears:  Canals normal.  Tympanic membranes normal.  Mild tenderness to palpation over right TMJ. Nose:  Mildly congested turbinates.  No sinus tenderness.  Mouth:  three small 3 to 20mm erythematous macules posterior hard palate. Pharynx:  Normal Neck:  Supple.  No adenopathy. Lungs:  Clear  to auscultation.  Breath sounds are equal.  Moving air well. Heart:  Regular rate and rhythm without murmurs, rubs, or gallops.  Abdomen:  Nontender without masses or hepatosplenomegaly.  Bowel sounds are present.  No CVA or flank tenderness.  Extremities:  No edema.  Skin:  No rash present.   UC Treatments / Results  Labs (all labs ordered are listed, but only abnormal results are displayed) Labs Reviewed -   Tympanometry:  Right ear tympanogram normal; Left ear tympanogram "noisy" but otherwise normal.  EKG   Radiology No results found.  Procedures Procedures (including critical care time)  Medications Ordered in UC Medications - No data to display  Initial Impression / Assessment and Plan / UC Course  I have reviewed the triage vital signs and the nursing notes.  Pertinent labs & imaging results that were available during my care of the patient were reviewed by me and considered in my medical decision making (see chart for details).    Benign exam except mild right TMJ tenderness.  There is no evidence of bacterial infection today.  Treat symptomatically for now. Followup with Family Doctor if not improved in one week.   Final Clinical Impressions(s) / UC Diagnoses   Final diagnoses:  Feared condition not demonstrated     Discharge Instructions      Try taking Pseudoephedrine (30mg , one or two every 4 to 6 hours) for sinus congestion.   Continue using saline nasal irrigation, Zyrtec, and Flonase nasal spray.       ED Prescriptions   None       , MD 12/19/20 1539

## 2020-12-17 NOTE — ED Triage Notes (Signed)
Patient c/o right ear pain/pressure x 4 days.  The ear was achy last night.  Today patient noticed "splotches" in her throat.  Patient did have her tonsils removed about 2 years ago.  Patient denies OTC pain meds.  Patient is not vaccinated.

## 2020-12-17 NOTE — Discharge Instructions (Addendum)
Try taking Pseudoephedrine (30mg , one or two every 4 to 6 hours) for sinus congestion.   Continue using saline nasal irrigation, Zyrtec, and Flonase nasal spray.

## 2021-02-01 DIAGNOSIS — M5412 Radiculopathy, cervical region: Secondary | ICD-10-CM | POA: Diagnosis not present

## 2021-02-01 DIAGNOSIS — M25511 Pain in right shoulder: Secondary | ICD-10-CM | POA: Diagnosis not present

## 2021-02-01 DIAGNOSIS — M5030 Other cervical disc degeneration, unspecified cervical region: Secondary | ICD-10-CM | POA: Diagnosis not present

## 2021-02-01 DIAGNOSIS — M542 Cervicalgia: Secondary | ICD-10-CM | POA: Diagnosis not present

## 2021-03-24 DIAGNOSIS — M25511 Pain in right shoulder: Secondary | ICD-10-CM | POA: Diagnosis not present

## 2021-03-24 DIAGNOSIS — M5412 Radiculopathy, cervical region: Secondary | ICD-10-CM | POA: Diagnosis not present

## 2021-03-31 DIAGNOSIS — M25511 Pain in right shoulder: Secondary | ICD-10-CM | POA: Diagnosis not present

## 2021-03-31 DIAGNOSIS — M5412 Radiculopathy, cervical region: Secondary | ICD-10-CM | POA: Diagnosis not present

## 2021-04-05 DIAGNOSIS — M5412 Radiculopathy, cervical region: Secondary | ICD-10-CM | POA: Diagnosis not present

## 2021-04-05 DIAGNOSIS — M25511 Pain in right shoulder: Secondary | ICD-10-CM | POA: Diagnosis not present

## 2021-04-12 DIAGNOSIS — M25511 Pain in right shoulder: Secondary | ICD-10-CM | POA: Diagnosis not present

## 2021-04-12 DIAGNOSIS — M5412 Radiculopathy, cervical region: Secondary | ICD-10-CM | POA: Diagnosis not present

## 2021-04-28 DIAGNOSIS — M5412 Radiculopathy, cervical region: Secondary | ICD-10-CM | POA: Diagnosis not present

## 2021-04-28 DIAGNOSIS — M25511 Pain in right shoulder: Secondary | ICD-10-CM | POA: Diagnosis not present

## 2021-05-02 DIAGNOSIS — M5412 Radiculopathy, cervical region: Secondary | ICD-10-CM | POA: Diagnosis not present

## 2021-05-02 DIAGNOSIS — M25511 Pain in right shoulder: Secondary | ICD-10-CM | POA: Diagnosis not present

## 2021-05-10 DIAGNOSIS — M5412 Radiculopathy, cervical region: Secondary | ICD-10-CM | POA: Diagnosis not present

## 2021-05-10 DIAGNOSIS — M25511 Pain in right shoulder: Secondary | ICD-10-CM | POA: Diagnosis not present

## 2021-05-25 DIAGNOSIS — M25511 Pain in right shoulder: Secondary | ICD-10-CM | POA: Diagnosis not present

## 2021-05-25 DIAGNOSIS — M5412 Radiculopathy, cervical region: Secondary | ICD-10-CM | POA: Diagnosis not present

## 2021-05-31 DIAGNOSIS — N281 Cyst of kidney, acquired: Secondary | ICD-10-CM | POA: Diagnosis not present

## 2021-08-31 DIAGNOSIS — E079 Disorder of thyroid, unspecified: Secondary | ICD-10-CM | POA: Diagnosis not present

## 2021-08-31 DIAGNOSIS — E1165 Type 2 diabetes mellitus with hyperglycemia: Secondary | ICD-10-CM | POA: Diagnosis not present

## 2021-08-31 DIAGNOSIS — E559 Vitamin D deficiency, unspecified: Secondary | ICD-10-CM | POA: Diagnosis not present

## 2021-08-31 DIAGNOSIS — E7849 Other hyperlipidemia: Secondary | ICD-10-CM | POA: Diagnosis not present

## 2021-11-10 DIAGNOSIS — W57XXXA Bitten or stung by nonvenomous insect and other nonvenomous arthropods, initial encounter: Secondary | ICD-10-CM | POA: Diagnosis not present

## 2021-11-10 DIAGNOSIS — R21 Rash and other nonspecific skin eruption: Secondary | ICD-10-CM | POA: Diagnosis not present

## 2022-02-20 DIAGNOSIS — G5603 Carpal tunnel syndrome, bilateral upper limbs: Secondary | ICD-10-CM | POA: Diagnosis not present

## 2022-03-22 DIAGNOSIS — G5603 Carpal tunnel syndrome, bilateral upper limbs: Secondary | ICD-10-CM | POA: Diagnosis not present

## 2022-04-05 DIAGNOSIS — G5603 Carpal tunnel syndrome, bilateral upper limbs: Secondary | ICD-10-CM | POA: Diagnosis not present

## 2022-04-13 DIAGNOSIS — G5603 Carpal tunnel syndrome, bilateral upper limbs: Secondary | ICD-10-CM | POA: Diagnosis not present

## 2022-04-17 DIAGNOSIS — E782 Mixed hyperlipidemia: Secondary | ICD-10-CM | POA: Diagnosis not present

## 2022-04-17 DIAGNOSIS — Z1331 Encounter for screening for depression: Secondary | ICD-10-CM | POA: Diagnosis not present

## 2022-04-17 DIAGNOSIS — E1165 Type 2 diabetes mellitus with hyperglycemia: Secondary | ICD-10-CM | POA: Diagnosis not present

## 2022-04-17 DIAGNOSIS — E559 Vitamin D deficiency, unspecified: Secondary | ICD-10-CM | POA: Diagnosis not present

## 2022-04-17 DIAGNOSIS — E079 Disorder of thyroid, unspecified: Secondary | ICD-10-CM | POA: Diagnosis not present

## 2022-04-17 DIAGNOSIS — E785 Hyperlipidemia, unspecified: Secondary | ICD-10-CM | POA: Diagnosis not present

## 2022-04-17 DIAGNOSIS — Z6829 Body mass index (BMI) 29.0-29.9, adult: Secondary | ICD-10-CM | POA: Diagnosis not present

## 2022-04-17 DIAGNOSIS — E119 Type 2 diabetes mellitus without complications: Secondary | ICD-10-CM | POA: Diagnosis not present

## 2022-04-17 DIAGNOSIS — Z0001 Encounter for general adult medical examination with abnormal findings: Secondary | ICD-10-CM | POA: Diagnosis not present

## 2022-05-29 DIAGNOSIS — Z6827 Body mass index (BMI) 27.0-27.9, adult: Secondary | ICD-10-CM | POA: Diagnosis not present

## 2022-05-29 DIAGNOSIS — J069 Acute upper respiratory infection, unspecified: Secondary | ICD-10-CM | POA: Diagnosis not present

## 2022-05-29 DIAGNOSIS — E663 Overweight: Secondary | ICD-10-CM | POA: Diagnosis not present

## 2022-06-12 DIAGNOSIS — Z79899 Other long term (current) drug therapy: Secondary | ICD-10-CM | POA: Diagnosis not present

## 2022-06-12 DIAGNOSIS — Z885 Allergy status to narcotic agent status: Secondary | ICD-10-CM | POA: Diagnosis not present

## 2022-06-12 DIAGNOSIS — G43909 Migraine, unspecified, not intractable, without status migrainosus: Secondary | ICD-10-CM | POA: Diagnosis not present

## 2022-06-12 DIAGNOSIS — R519 Headache, unspecified: Secondary | ICD-10-CM | POA: Diagnosis not present

## 2022-06-12 DIAGNOSIS — R079 Chest pain, unspecified: Secondary | ICD-10-CM | POA: Diagnosis not present

## 2022-06-12 DIAGNOSIS — H538 Other visual disturbances: Secondary | ICD-10-CM | POA: Diagnosis not present

## 2022-06-12 DIAGNOSIS — R739 Hyperglycemia, unspecified: Secondary | ICD-10-CM | POA: Diagnosis not present

## 2022-06-12 DIAGNOSIS — Z883 Allergy status to other anti-infective agents status: Secondary | ICD-10-CM | POA: Diagnosis not present

## 2022-06-12 DIAGNOSIS — H539 Unspecified visual disturbance: Secondary | ICD-10-CM | POA: Diagnosis not present

## 2022-06-12 DIAGNOSIS — Z20822 Contact with and (suspected) exposure to covid-19: Secondary | ICD-10-CM | POA: Diagnosis not present

## 2022-06-12 DIAGNOSIS — R0789 Other chest pain: Secondary | ICD-10-CM | POA: Diagnosis not present

## 2022-06-12 DIAGNOSIS — G43009 Migraine without aura, not intractable, without status migrainosus: Secondary | ICD-10-CM | POA: Diagnosis not present

## 2022-06-12 DIAGNOSIS — R112 Nausea with vomiting, unspecified: Secondary | ICD-10-CM | POA: Diagnosis not present

## 2022-06-12 DIAGNOSIS — Z1152 Encounter for screening for COVID-19: Secondary | ICD-10-CM | POA: Diagnosis not present

## 2022-06-14 DIAGNOSIS — M542 Cervicalgia: Secondary | ICD-10-CM | POA: Diagnosis not present

## 2022-06-14 DIAGNOSIS — G5603 Carpal tunnel syndrome, bilateral upper limbs: Secondary | ICD-10-CM | POA: Diagnosis not present

## 2022-06-29 DIAGNOSIS — H524 Presbyopia: Secondary | ICD-10-CM | POA: Diagnosis not present

## 2022-06-29 DIAGNOSIS — E119 Type 2 diabetes mellitus without complications: Secondary | ICD-10-CM | POA: Diagnosis not present

## 2022-06-29 DIAGNOSIS — Z7984 Long term (current) use of oral hypoglycemic drugs: Secondary | ICD-10-CM | POA: Diagnosis not present

## 2022-07-04 DIAGNOSIS — G5603 Carpal tunnel syndrome, bilateral upper limbs: Secondary | ICD-10-CM | POA: Diagnosis not present

## 2022-07-11 ENCOUNTER — Encounter (HOSPITAL_BASED_OUTPATIENT_CLINIC_OR_DEPARTMENT_OTHER): Payer: Self-pay | Admitting: Orthopedic Surgery

## 2022-07-19 ENCOUNTER — Other Ambulatory Visit: Payer: Self-pay

## 2022-07-19 ENCOUNTER — Encounter (HOSPITAL_BASED_OUTPATIENT_CLINIC_OR_DEPARTMENT_OTHER): Payer: Self-pay | Admitting: Orthopedic Surgery

## 2022-07-25 ENCOUNTER — Encounter (HOSPITAL_BASED_OUTPATIENT_CLINIC_OR_DEPARTMENT_OTHER)
Admission: RE | Admit: 2022-07-25 | Discharge: 2022-07-25 | Disposition: A | Discharge status: Home / Self Care | Payer: 59 | Source: Ambulatory Visit | Attending: Orthopedic Surgery | Admitting: Orthopedic Surgery

## 2022-07-25 DIAGNOSIS — E119 Type 2 diabetes mellitus without complications: Secondary | ICD-10-CM | POA: Diagnosis not present

## 2022-07-25 DIAGNOSIS — D759 Disease of blood and blood-forming organs, unspecified: Secondary | ICD-10-CM | POA: Diagnosis not present

## 2022-07-25 DIAGNOSIS — Z01810 Encounter for preprocedural cardiovascular examination: Secondary | ICD-10-CM | POA: Insufficient documentation

## 2022-07-25 DIAGNOSIS — G5601 Carpal tunnel syndrome, right upper limb: Secondary | ICD-10-CM | POA: Diagnosis present

## 2022-07-25 DIAGNOSIS — D649 Anemia, unspecified: Secondary | ICD-10-CM | POA: Diagnosis not present

## 2022-07-25 DIAGNOSIS — Z7984 Long term (current) use of oral hypoglycemic drugs: Secondary | ICD-10-CM | POA: Diagnosis not present

## 2022-07-25 LAB — BASIC METABOLIC PANEL
Anion gap: 10 (ref 5–15)
BUN: 14 mg/dL (ref 6–20)
CO2: 22 mmol/L (ref 22–32)
Calcium: 8.9 mg/dL (ref 8.9–10.3)
Chloride: 103 mmol/L (ref 98–111)
Creatinine, Ser: 0.79 mg/dL (ref 0.44–1.00)
GFR, Estimated: >60 mL/min (ref >60)
Glucose, Bld: 238 mg/dL — ABNORMAL HIGH (ref 70–99)
Potassium: 4.1 mmol/L (ref 3.5–5.1)
Sodium: 135 mmol/L (ref 135–145)

## 2022-07-25 NOTE — Progress Notes (Signed)

## 2022-07-25 NOTE — Anesthesia Preprocedure Evaluation (Signed)
Anesthesia Evaluation  Patient identified by MRN, date of birth, ID band Patient awake    Reviewed: Allergy & Precautions, NPO status , Patient's Chart, lab work & pertinent test results  History of Anesthesia Complications (+) PONV and history of anesthetic complications  Airway Mallampati: I  TM Distance: >3 FB Neck ROM: Full    Dental  (+) Teeth Intact, Dental Advisory Given   Pulmonary neg pulmonary ROS   Pulmonary exam normal breath sounds clear to auscultation       Cardiovascular (-) hypertension(-) angina (-) Past MI (-) dysrhythmias  Rhythm:Regular Rate:Normal  H/o pericarditis   Neuro/Psych negative neurological ROS     GI/Hepatic negative GI ROS, Neg liver ROS,,,  Endo/Other  diabetes, Type 2, Oral Hypoglycemic Agents    Renal/GU Renal disease (h/o nephrolithiasis)     Musculoskeletal   Abdominal   Peds  Hematology  (+) Blood dyscrasia, anemia   Anesthesia Other Findings   Reproductive/Obstetrics endometriosis                             Anesthesia Physical Anesthesia Plan  ASA: 2  Anesthesia Plan: MAC   Post-op Pain Management:    Induction: Intravenous  PONV Risk Score and Plan: 3 and Ondansetron, Dexamethasone, Propofol infusion and Treatment may vary due to age or medical condition  Airway Management Planned: Natural Airway and Simple Face Mask  Additional Equipment:   Intra-op Plan:   Post-operative Plan:   Informed Consent: I have reviewed the patients History and Physical, chart, labs and discussed the procedure including the risks, benefits and alternatives for the proposed anesthesia with the patient or authorized representative who has indicated his/her understanding and acceptance.     Dental advisory given  Plan Discussed with: CRNA and Anesthesiologist  Anesthesia Plan Comments: (Discussed with patient risks of MAC including, but not limited to,  minor pain or discomfort, hearing people in the room, and possible need for backup general anesthesia. Risks for general anesthesia also discussed including, but not limited to, sore throat, hoarse voice, chipped/damaged teeth, injury to vocal cords, nausea and vomiting, allergic reactions, lung infection, heart attack, stroke, and death. All questions answered. )        Anesthesia Quick Evaluation

## 2022-07-26 ENCOUNTER — Ambulatory Visit (HOSPITAL_BASED_OUTPATIENT_CLINIC_OR_DEPARTMENT_OTHER): Payer: 59 | Admitting: Anesthesiology

## 2022-07-26 ENCOUNTER — Encounter (HOSPITAL_BASED_OUTPATIENT_CLINIC_OR_DEPARTMENT_OTHER)
Admission: RE | Disposition: A | Discharge status: Home / Self Care | Payer: Self-pay | Source: Home / Self Care | Attending: Orthopedic Surgery

## 2022-07-26 ENCOUNTER — Ambulatory Visit (HOSPITAL_BASED_OUTPATIENT_CLINIC_OR_DEPARTMENT_OTHER)
Admission: RE | Admit: 2022-07-26 | Discharge: 2022-07-26 | Disposition: A | Discharge status: Home / Self Care | Payer: 59 | Attending: Orthopedic Surgery | Admitting: Orthopedic Surgery

## 2022-07-26 ENCOUNTER — Encounter (HOSPITAL_BASED_OUTPATIENT_CLINIC_OR_DEPARTMENT_OTHER): Payer: Self-pay | Admitting: Orthopedic Surgery

## 2022-07-26 ENCOUNTER — Other Ambulatory Visit: Payer: Self-pay

## 2022-07-26 DIAGNOSIS — E119 Type 2 diabetes mellitus without complications: Secondary | ICD-10-CM

## 2022-07-26 DIAGNOSIS — Z7984 Long term (current) use of oral hypoglycemic drugs: Secondary | ICD-10-CM

## 2022-07-26 DIAGNOSIS — D649 Anemia, unspecified: Secondary | ICD-10-CM | POA: Insufficient documentation

## 2022-07-26 DIAGNOSIS — D759 Disease of blood and blood-forming organs, unspecified: Secondary | ICD-10-CM | POA: Insufficient documentation

## 2022-07-26 DIAGNOSIS — G5601 Carpal tunnel syndrome, right upper limb: Secondary | ICD-10-CM

## 2022-07-26 HISTORY — DX: Type 2 diabetes mellitus without complications: E11.9

## 2022-07-26 HISTORY — PX: CARPAL TUNNEL RELEASE: SHX101

## 2022-07-26 HISTORY — DX: Anemia, unspecified: D64.9

## 2022-07-26 HISTORY — DX: Other specified postprocedural states: Z98.890

## 2022-07-26 LAB — GLUCOSE, CAPILLARY
Glucose-Capillary: 175 mg/dL — ABNORMAL HIGH (ref 70–99)
Glucose-Capillary: 138 mg/dL — ABNORMAL HIGH (ref 70–99)

## 2022-07-26 SURGERY — CARPAL TUNNEL RELEASE
Anesthesia: Monitor Anesthesia Care | Site: Hand | Laterality: Right

## 2022-07-26 MED ORDER — PROPOFOL 500 MG/50ML IV EMUL
INTRAVENOUS | Status: DC | PRN
  Administered 2022-07-26: 50 ug/kg/min via INTRAVENOUS

## 2022-07-26 MED ORDER — BUPIVACAINE HCL 0.25 % IJ SOLN
INTRAMUSCULAR | Status: DC | PRN
  Administered 2022-07-26: 10 mL

## 2022-07-26 MED ORDER — OXYCODONE HCL 5 MG PO TABS
ORAL_TABLET | ORAL | Status: AC
  Filled 2022-07-26: qty 1

## 2022-07-26 MED ORDER — LIDOCAINE HCL 1 % IJ SOLN
INTRAMUSCULAR | Status: DC | PRN
  Administered 2022-07-26: 40 mg via INTRADERMAL

## 2022-07-26 MED ORDER — DEXAMETHASONE SODIUM PHOSPHATE 10 MG/ML IJ SOLN
INTRAMUSCULAR | Status: AC
  Filled 2022-07-26: qty 1

## 2022-07-26 MED ORDER — PROPOFOL 500 MG/50ML IV EMUL
INTRAVENOUS | Status: AC
  Filled 2022-07-26: qty 50

## 2022-07-26 MED ORDER — OXYCODONE HCL 5 MG PO TABS
5.0000 mg | ORAL_TABLET | Freq: Once | ORAL | Status: AC | PRN
  Administered 2022-07-26: 5 mg via ORAL

## 2022-07-26 MED ORDER — FENTANYL CITRATE (PF) 100 MCG/2ML IJ SOLN
INTRAMUSCULAR | Status: DC | PRN
  Administered 2022-07-26: 25 ug via INTRAVENOUS
  Administered 2022-07-26: 50 ug via INTRAVENOUS
  Administered 2022-07-26: 25 ug via INTRAVENOUS

## 2022-07-26 MED ORDER — DEXAMETHASONE SODIUM PHOSPHATE 10 MG/ML IJ SOLN
INTRAMUSCULAR | Status: DC | PRN
  Administered 2022-07-26: 4 mg via INTRAVENOUS

## 2022-07-26 MED ORDER — MIDAZOLAM HCL 2 MG/2ML IJ SOLN
INTRAMUSCULAR | Status: AC
  Filled 2022-07-26: qty 2

## 2022-07-26 MED ORDER — PROPOFOL 10 MG/ML IV BOLUS
INTRAVENOUS | Status: DC | PRN
  Administered 2022-07-26 (×2): 10 mg via INTRAVENOUS

## 2022-07-26 MED ORDER — MIDAZOLAM HCL 5 MG/5ML IJ SOLN
INTRAMUSCULAR | Status: DC | PRN
  Administered 2022-07-26: 1 mg via INTRAVENOUS

## 2022-07-26 MED ORDER — AMISULPRIDE (ANTIEMETIC) 5 MG/2ML IV SOLN: 10.0000 mg | Freq: Once | INTRAVENOUS | Status: DC | PRN

## 2022-07-26 MED ORDER — LIDOCAINE 2% (20 MG/ML) 5 ML SYRINGE
INTRAMUSCULAR | Status: AC
  Filled 2022-07-26: qty 5

## 2022-07-26 MED ORDER — ONDANSETRON HCL 4 MG/2ML IJ SOLN
INTRAMUSCULAR | Status: DC | PRN
  Administered 2022-07-26: 4 mg via INTRAVENOUS

## 2022-07-26 MED ORDER — LACTATED RINGERS IV SOLN: INTRAVENOUS | Status: DC

## 2022-07-26 MED ORDER — OXYCODONE HCL 5 MG/5ML PO SOLN: 5.0000 mg | Freq: Once | ORAL | Status: AC | PRN

## 2022-07-26 MED ORDER — FENTANYL CITRATE (PF) 100 MCG/2ML IJ SOLN: 25.0000 ug | INTRAMUSCULAR | Status: DC | PRN

## 2022-07-26 MED ORDER — FENTANYL CITRATE (PF) 100 MCG/2ML IJ SOLN
INTRAMUSCULAR | Status: AC
  Filled 2022-07-26: qty 2

## 2022-07-26 SURGICAL SUPPLY — 36 items
APL PRP STRL LF DISP 70% ISPRP (MISCELLANEOUS) ×1
BLADE SURG 15 STRL LF DISP TIS (BLADE) ×2 IMPLANT
BLADE SURG 15 STRL SS (BLADE) ×2
BNDG CMPR 9X4 STRL LF SNTH (GAUZE/BANDAGES/DRESSINGS) ×1
BNDG ELASTIC 3X5.8 VLCR STR LF (GAUZE/BANDAGES/DRESSINGS) ×2 IMPLANT
BNDG ESMARK 4X9 LF (GAUZE/BANDAGES/DRESSINGS) ×2 IMPLANT
BNDG GAUZE DERMACEA FLUFF 4 (GAUZE/BANDAGES/DRESSINGS) ×2 IMPLANT
BNDG GZE DERMACEA 4 6PLY (GAUZE/BANDAGES/DRESSINGS) ×1
CHLORAPREP W/TINT 26 (MISCELLANEOUS) ×2 IMPLANT
CORD BIPOLAR FORCEPS 12FT (ELECTRODE) ×2 IMPLANT
COVER BACK TABLE 60X90IN (DRAPES) ×2 IMPLANT
CUFF TOURN SGL QUICK 18X4 (TOURNIQUET CUFF) IMPLANT
CUFF TOURN SGL QUICK 24 (TOURNIQUET CUFF)
CUFF TRNQT CYL 24X4X16.5-23 (TOURNIQUET CUFF) IMPLANT
DRAPE EXTREMITY T 121X128X90 (DISPOSABLE) ×2 IMPLANT
DRAPE SURG 17X23 STRL (DRAPES) ×2 IMPLANT
GAUZE XEROFORM 1X8 LF (GAUZE/BANDAGES/DRESSINGS) ×2 IMPLANT
GLOVE BIO SURGEON STRL SZ7 (GLOVE) ×2 IMPLANT
GLOVE BIOGEL PI IND STRL 7.0 (GLOVE) ×2 IMPLANT
GOWN STRL REUS W/ TWL LRG LVL3 (GOWN DISPOSABLE) ×4 IMPLANT
GOWN STRL REUS W/TWL LRG LVL3 (GOWN DISPOSABLE) ×2
NDL HYPO 25X1 1.5 SAFETY (NEEDLE) IMPLANT
NEEDLE HYPO 25X1 1.5 SAFETY (NEEDLE) ×1 IMPLANT
NS IRRIG 1000ML POUR BTL (IV SOLUTION) ×2 IMPLANT
PACK BASIN DAY SURGERY FS (CUSTOM PROCEDURE TRAY) ×2 IMPLANT
PAD CAST 3X4 CTTN HI CHSV (CAST SUPPLIES) ×2 IMPLANT
PADDING CAST COTTON 3X4 STRL (CAST SUPPLIES)
SHEET MEDIUM DRAPE 40X70 STRL (DRAPES) ×2 IMPLANT
SLEEVE SCD COMPRESS KNEE MED (STOCKING) IMPLANT
SUT ETHILON 4 0 PS 2 18 (SUTURE) ×2 IMPLANT
SUT MNCRL AB 3-0 PS2 18 (SUTURE) IMPLANT
SUT VICRYL 4-0 PS2 18IN ABS (SUTURE) IMPLANT
SYR BULB EAR ULCER 3OZ GRN STR (SYRINGE) ×2 IMPLANT
SYR CONTROL 10ML LL (SYRINGE) ×2 IMPLANT
TOWEL GREEN STERILE FF (TOWEL DISPOSABLE) ×4 IMPLANT
UNDERPAD 30X36 HEAVY ABSORB (UNDERPADS AND DIAPERS) ×2 IMPLANT

## 2022-07-26 NOTE — Anesthesia Postprocedure Evaluation (Signed)
Anesthesia Post Note  Patient: Ellen Morales  Procedure(s) Performed: CARPAL TUNNEL RELEASE (Right: Hand)     Patient location during evaluation: PACU Anesthesia Type: MAC Level of consciousness: awake Pain management: pain level controlled Vital Signs Assessment: post-procedure vital signs reviewed and stable Respiratory status: spontaneous breathing, nonlabored ventilation and respiratory function stable Cardiovascular status: stable and blood pressure returned to baseline Postop Assessment: no apparent nausea or vomiting Anesthetic complications: no   No notable events documented.  Last Vitals:  Vitals:   07/26/22 1030 07/26/22 1034  BP: 110/66 107/66  Pulse: 64 60  Resp: 14 15  Temp:    SpO2: 97% 97%    Last Pain:  Vitals:   07/26/22 1034  TempSrc:   PainSc: 0-No pain                 Nilda Simmer

## 2022-07-26 NOTE — H&P (Signed)
HAND SURGERY   HPI: Patient is a 47 y.o. female who presents with right carpal tunnel syndrome that has failed conservative management.  She presents today for right carpal tunnel release.  Patient denies any changes to their medical history or new systemic symptoms today.    Past Medical History:  Diagnosis Date   Anemia    ASCUS of cervix with negative high risk HPV 09/2013   Diabetes mellitus without complication (HCC)    Endometriosis    H/O pericarditis 03/22/2017   Kidney stones    PONV (postoperative nausea and vomiting)    Past Surgical History:  Procedure Laterality Date   ABDOMINAL HYSTERECTOMY     APPENDECTOMY     BREAST ENHANCEMENT SURGERY     EYE SURGERY     FOOT SURGERY     kidney stone removal     NASAL SINUS SURGERY     OVARIAN CYST REMOVAL     TUBAL LIGATION     tummy tuck     Social History   Socioeconomic History   Marital status: Married    Spouse name: Not on file   Number of children: Not on file   Years of education: Not on file   Highest education level: Not on file  Occupational History   Not on file  Tobacco Use   Smoking status: Never   Smokeless tobacco: Never  Vaping Use   Vaping Use: Never used  Substance and Sexual Activity   Alcohol use: Yes   Drug use: No   Sexual activity: Yes    Birth control/protection: Surgical  Other Topics Concern   Not on file  Social History Narrative   Not on file   Social Determinants of Health   Financial Resource Strain: Not on file  Food Insecurity: Not on file  Transportation Needs: Not on file  Physical Activity: Not on file  Stress: Not on file  Social Connections: Not on file   Family History  Problem Relation Age of Onset   Cancer Father        pancreatic   Cancer Mother        lung/ brain tumor removed   Diabetes Mother    Ovarian cancer Maternal Aunt    - negative except otherwise stated in the family history section Allergies  Allergen Reactions   Other Itching    Tree  nuts   Tape Other (See Comments)    Skin burn   Biaxin [Clarithromycin]     REACTION: GI Upset. (Has taken azithromycin without problems)   Morphine And Related    Prior to Admission medications   Medication Sig Start Date End Date Taking? Authorizing Provider  Continuous Blood Gluc Sensor ([place]) MISC by Does not apply route.   Yes [provider]  levocetirizine (XYZAL) 5 MG tablet Take 5 mg by mouth every evening.   Yes [provider]  meloxicam (MOBIC) 7.5 MG tablet Take 7.5 mg by mouth daily.   Yes [provider]  sitaGLIPtin (JANUVIA) 100 MG tablet Take 100 mg by mouth daily.   Yes [provider]   No results found. - Positive ROS: All other systems have been reviewed and were otherwise negative with the exception of those mentioned in the HPI and as above.  Physical Exam: General: No acute distress, resting comfortably Cardiovascular: BUE warm and well perfused, normal rate Respiratory: Normal WOB on RA Skin: Warm and dry Neurologic: Sensation intact distally Psychiatric: Patient is at baseline mood  and affect  Right Hand Full and painless AROM of all fingers No thenar atrophy w/ 5/5 thenar motor strength Positive Tinel, Phalen, Durkan signs SILT m/u/r distribution Hand warm and well perfused w/ BCR  Assessment: 47 yo F w/ R CTS that has failed conservative management  Plan: OR today for right carpal tunnel release. We again reviewed the risks of surgery which include bleeding, infection, damage to the median nerve or it's branches, persistent symptoms, pillar pain, delayed wound healing, need for additional surgery.  Informed consent was signed.  All questions were answered.   Sherilyn Cooter, M.D. [facility] 9:18 AM

## 2022-07-26 NOTE — Op Note (Signed)
   Date of Surgery: 07/26/2022  INDICATIONS: Patient is a 47 y.o.-year-old female with right carpal tunnel syndrome that has failed conservative management.  Risks, benefits, and alternatives to surgery were again discussed with the patient in the preoperative area. The patient wishes to proceed with surgery.  Informed consent was signed after our discussion.   PREOPERATIVE DIAGNOSIS:  Right carpal tunnel syndrome  POSTOPERATIVE DIAGNOSIS: Same.  PROCEDURE:  Right carpal tunnel release   SURGEON: Audria Nine, M.D.  ASSIST:   ANESTHESIA:  Local w/ MAC, 10cc of 0.25% plain marcaine  IV FLUIDS AND URINE: See anesthesia.  ESTIMATED BLOOD LOSS: <5 mL.  IMPLANTS: * No implants in log *   DRAINS: None  COMPLICATIONS: None  DESCRIPTION OF PROCEDURE: The patient was met in the preoperative holding area where the surgical site was marked and the consent form was verified.  The patient was then taken to the operating room.  A hand table was placed adjacent to the operative extremity and locked into place.  All bony prominences were well padded.  A tourniquet was applied to the right forearm.  Monitored sedation was induced.   A formal time-out was performed to confirm that this was the correct patient, surgery, side, and site. A local block was performed using 10cc of 0.25% plain marcaine. The operative extremity was prepped and draped in the usual and sterile fashion.    Following a second timeout, the limb was exsanguinated and the tourniquet inflated to 250 mmHg.  A longitudinal incision was made in line with the radial border of the ring finger from distal to the wrist flexion crease to the intersection of Kaplan's cardinal line.  The skin and subcutaneous tissue was sharply divided.  The longitudinally running palmar fascia was incised.  A small portion of thenar musculature was bluntly swept off of the transverse carpal ligament.  The ligament was divided from proximal to distal until  the fat surrounding the palmar arch was encountered. Retractors were then placed in the proximal aspect of the wound to visualize the distal antebrachial fascia.  The fascia was sharply divided under direct visualization.   The wound was then thoroughly irrigated with sterile saline.  The tourniquet was deflated.  Hemostasis was achieved with direct pressure and bipolar electrocautery.  The wound was then closed with 4-0 nylon sutures in a horizontal mattress fashion. The wound was then dressed with xeroform, folded kerlix, and an ace wrap.  The patient was then reversed from anesthesia and transferred to the postoperative bed.  All counts were correct x 2 at the end of the procedure.  The patient was taken to the recovery unit in stable condition.     POSTOPERATIVE PLAN: She will be discharged to home with appropriate pain medication and discharge instructions.  I will see her back in the office in 10-14 days for her first postop visit.   Audria Nine, MD 10:14 AM

## 2022-07-26 NOTE — Interval H&P Note (Signed)
History and Physical Interval Note:  07/26/2022 9:20 AM  Ellen Morales  has presented today for surgery, with the diagnosis of Right carpal tunnel syndrome.  The various methods of treatment have been discussed with the patient and family. After consideration of risks, benefits and other options for treatment, the patient has consented to  Procedure(s) with comments: [place] (Right) - with local 60 as a surgical intervention.  The patient's history has been reviewed, patient examined, no change in status, stable for surgery.  I have reviewed the patient's chart and labs.  Questions were answered to the patient's satisfaction.     Lamyia Cdebaca [place]

## 2022-07-26 NOTE — Transfer of Care (Signed)
Immediate Anesthesia Transfer of Care Note  Patient: Ellen Morales  Procedure(s) Performed: CARPAL TUNNEL RELEASE (Right: Hand)  Patient Location: PACU  Anesthesia Type:MAC  Level of Consciousness: awake, alert , oriented, and patient cooperative  Airway & Oxygen Therapy: Patient Spontanous Breathing and Patient connected to nasal cannula oxygen  Post-op Assessment: Report given to RN and Post -op Vital signs reviewed and stable  Post vital signs: Reviewed and stable  Last Vitals:  Vitals Value Taken Time  BP 108/69 07/26/22 1017  Temp    Pulse 68 07/26/22 1018  Resp 11 07/26/22 1018  SpO2 95 % 07/26/22 1018  Vitals shown include unvalidated device data.  Last Pain:  Vitals:   07/26/22 0847  TempSrc: Oral  PainSc: 0-No pain         Complications: No notable events documented.

## 2022-07-26 NOTE — Discharge Instructions (Addendum)
Audria Nine, M.D. Hand Surgery  POST-OPERATIVE DISCHARGE INSTRUCTIONS   PRESCRIPTIONS: - You may have been given a prescription to be taken as directed for post-operative pain control.  You may also take over the counter ibuprofen/aleve and tylenol for pain. Take this as directed on the packaging. Do not exceed 3000 mg tylenol/acetaminophen in 24 hours.  Ibuprofen 600-800 mg (3-4) tablets by mouth every 6 hours as needed for pain.   OR  Aleve 2 tablets by mouth every 12 hours (twice daily) as needed for pain.   AND/OR  Tylenol 1000 mg (2 tablets) every 8 hours as needed for pain.  - Please use your pain medication carefully, as refills are limited and you may not be provided with one.  As stated above, please use over the counter pain medicine - it will also be helpful with decreasing your swelling.    ANESTHESIA: -After your surgery, post-surgical discomfort or pain is likely. This discomfort can last several days to a few weeks. At certain times of the day your discomfort may be more intense.   Did you receive a nerve block?   - A nerve block can provide pain relief for one hour to two days after your surgery. As long as the nerve block is working, you will experience little or no sensation in the area the surgeon operated on.  - As the nerve block wears off, you will begin to experience pain or discomfort. It is very important that you begin taking your prescribed pain medication before the nerve block fully wears off. Treating your pain at the first sign of the block wearing off will ensure your pain is better controlled and more tolerable when full-sensation returns. Do not wait until the pain is intolerable, as the medicine will be less effective. It is better to treat pain in advance than to try and catch up.   General Anesthesia:  If you did not receive a nerve block during your surgery, you will need to start taking your pain medication shortly after your surgery and  should continue to do so as prescribed by your surgeon.     ICE AND ELEVATION: - You may use ice for the first 48-72 hours, but it is not critical.   - Motion of your fingers is very important to decrease the swelling.  - Elevation, as much as possible for the next 48 hours, is critical for decreasing swelling as well as for pain relief. Elevation means when you are seated or lying down, you hand should be at or above your heart. When walking, the hand needs to be at or above the level of your elbow.  - If the bandage gets too tight, it may need to be loosened. Please contact our office and we will instruct you in how to do this.    SURGICAL BANDAGES:  - Keep your dressing and/or splint clean and dry at all times.  You can remove your dressing 7 days from now and change with a dry dressing or Band-Aids as needed thereafter. - You may place a plastic bag over your bandage to shower, but be careful, do not get your bandages wet.  - After the bandages have been removed, it is OK to get the stitches wet in a shower or with hand washing. Do Not soak or submerge the wound yet. Please do not use lotions or creams on the stitches.      HAND THERAPY:  - You may not need any. If you  do, we will begin this at your follow up visit in the clinic.    ACTIVITY AND WORK: - You are encouraged to move any fingers which are not in the bandage.  - Light use of the fingers is allowed to assist the other hand with daily hygiene and eating, but strong gripping or lifting is often uncomfortable and should be avoided.  - You might miss a variable period of time from work and hopefully this issue has been discussed prior to surgery. You may not do any heavy work with your affected hand for about 2 weeks.    [facility] [address] Floor, [address] [place], [place] [address] [phone]    Post Anesthesia Home Care Instructions  Activity: Get plenty of rest for the remainder of the day. A  responsible individual must stay with you for 24 hours following the procedure.  For the next 24 hours, DO NOT: -Drive a car -Paediatric nurse -Drink alcoholic beverages -Take any medication unless instructed by your physician -Make any legal decisions or sign important papers.  Meals: Start with liquid foods such as gelatin or soup. Progress to regular foods as tolerated. Avoid greasy, spicy, heavy foods. If nausea and/or vomiting occur, drink only clear liquids until the nausea and/or vomiting subsides. Call your physician if vomiting continues.  Special Instructions/Symptoms: Your throat may feel dry or sore from the anesthesia or the breathing tube placed in your throat during surgery. If this causes discomfort, gargle with warm salt water. The discomfort should disappear within 24 hours.  If you had a scopolamine patch placed behind your ear for the management of post- operative nausea and/or vomiting:  1. The medication in the patch is effective for 72 hours, after which it should be removed.  Wrap patch in a tissue and discard in the trash. Wash hands thoroughly with soap and water. 2. You may remove the patch earlier than 72 hours if you experience unpleasant side effects which may include dry mouth, dizziness or visual disturbances. 3. Avoid touching the patch. Wash your hands with soap and water after contact with the patch.

## 2022-07-27 ENCOUNTER — Encounter (HOSPITAL_BASED_OUTPATIENT_CLINIC_OR_DEPARTMENT_OTHER): Payer: Self-pay | Admitting: Orthopedic Surgery

## 2022-09-14 DIAGNOSIS — R5382 Chronic fatigue, unspecified: Secondary | ICD-10-CM | POA: Diagnosis not present

## 2022-09-14 DIAGNOSIS — R400 Somnolence: Secondary | ICD-10-CM | POA: Diagnosis not present

## 2022-12-01 DIAGNOSIS — E1165 Type 2 diabetes mellitus with hyperglycemia: Secondary | ICD-10-CM | POA: Diagnosis not present

## 2022-12-01 DIAGNOSIS — Z1330 Encounter for screening examination for mental health and behavioral disorders, unspecified: Secondary | ICD-10-CM | POA: Diagnosis not present

## 2023-02-13 DIAGNOSIS — M9903 Segmental and somatic dysfunction of lumbar region: Secondary | ICD-10-CM | POA: Diagnosis not present

## 2023-02-13 DIAGNOSIS — M9904 Segmental and somatic dysfunction of sacral region: Secondary | ICD-10-CM | POA: Diagnosis not present

## 2023-02-13 DIAGNOSIS — M546 Pain in thoracic spine: Secondary | ICD-10-CM | POA: Diagnosis not present

## 2023-02-13 DIAGNOSIS — M9901 Segmental and somatic dysfunction of cervical region: Secondary | ICD-10-CM | POA: Diagnosis not present

## 2023-02-13 DIAGNOSIS — M9902 Segmental and somatic dysfunction of thoracic region: Secondary | ICD-10-CM | POA: Diagnosis not present

## 2023-02-20 DIAGNOSIS — M546 Pain in thoracic spine: Secondary | ICD-10-CM | POA: Diagnosis not present

## 2023-02-20 DIAGNOSIS — M9903 Segmental and somatic dysfunction of lumbar region: Secondary | ICD-10-CM | POA: Diagnosis not present

## 2023-02-20 DIAGNOSIS — M9901 Segmental and somatic dysfunction of cervical region: Secondary | ICD-10-CM | POA: Diagnosis not present

## 2023-02-20 DIAGNOSIS — M9902 Segmental and somatic dysfunction of thoracic region: Secondary | ICD-10-CM | POA: Diagnosis not present

## 2023-02-20 DIAGNOSIS — M9904 Segmental and somatic dysfunction of sacral region: Secondary | ICD-10-CM | POA: Diagnosis not present

## 2023-02-27 DIAGNOSIS — M9902 Segmental and somatic dysfunction of thoracic region: Secondary | ICD-10-CM | POA: Diagnosis not present

## 2023-02-27 DIAGNOSIS — M9904 Segmental and somatic dysfunction of sacral region: Secondary | ICD-10-CM | POA: Diagnosis not present

## 2023-02-27 DIAGNOSIS — M9903 Segmental and somatic dysfunction of lumbar region: Secondary | ICD-10-CM | POA: Diagnosis not present

## 2023-02-27 DIAGNOSIS — M546 Pain in thoracic spine: Secondary | ICD-10-CM | POA: Diagnosis not present

## 2023-02-27 DIAGNOSIS — M9901 Segmental and somatic dysfunction of cervical region: Secondary | ICD-10-CM | POA: Diagnosis not present

## 2023-03-05 DIAGNOSIS — M9904 Segmental and somatic dysfunction of sacral region: Secondary | ICD-10-CM | POA: Diagnosis not present

## 2023-03-05 DIAGNOSIS — M546 Pain in thoracic spine: Secondary | ICD-10-CM | POA: Diagnosis not present

## 2023-03-05 DIAGNOSIS — M9901 Segmental and somatic dysfunction of cervical region: Secondary | ICD-10-CM | POA: Diagnosis not present

## 2023-03-05 DIAGNOSIS — M9903 Segmental and somatic dysfunction of lumbar region: Secondary | ICD-10-CM | POA: Diagnosis not present

## 2023-03-05 DIAGNOSIS — M9902 Segmental and somatic dysfunction of thoracic region: Secondary | ICD-10-CM | POA: Diagnosis not present

## 2023-03-12 DIAGNOSIS — M9902 Segmental and somatic dysfunction of thoracic region: Secondary | ICD-10-CM | POA: Diagnosis not present

## 2023-03-12 DIAGNOSIS — M9901 Segmental and somatic dysfunction of cervical region: Secondary | ICD-10-CM | POA: Diagnosis not present

## 2023-03-12 DIAGNOSIS — M9903 Segmental and somatic dysfunction of lumbar region: Secondary | ICD-10-CM | POA: Diagnosis not present

## 2023-03-12 DIAGNOSIS — L089 Local infection of the skin and subcutaneous tissue, unspecified: Secondary | ICD-10-CM | POA: Diagnosis not present

## 2023-03-12 DIAGNOSIS — M7711 Lateral epicondylitis, right elbow: Secondary | ICD-10-CM | POA: Diagnosis not present

## 2023-03-12 DIAGNOSIS — M7712 Lateral epicondylitis, left elbow: Secondary | ICD-10-CM | POA: Diagnosis not present

## 2023-03-12 DIAGNOSIS — G5602 Carpal tunnel syndrome, left upper limb: Secondary | ICD-10-CM | POA: Diagnosis not present

## 2023-03-12 DIAGNOSIS — M546 Pain in thoracic spine: Secondary | ICD-10-CM | POA: Diagnosis not present

## 2023-03-12 DIAGNOSIS — M9904 Segmental and somatic dysfunction of sacral region: Secondary | ICD-10-CM | POA: Diagnosis not present

## 2023-03-19 DIAGNOSIS — Z6826 Body mass index (BMI) 26.0-26.9, adult: Secondary | ICD-10-CM | POA: Diagnosis not present

## 2023-03-19 DIAGNOSIS — K51511 Left sided colitis with rectal bleeding: Secondary | ICD-10-CM | POA: Diagnosis not present

## 2023-03-21 DIAGNOSIS — Z800 Family history of malignant neoplasm of digestive organs: Secondary | ICD-10-CM | POA: Diagnosis not present

## 2023-03-21 DIAGNOSIS — K625 Hemorrhage of anus and rectum: Secondary | ICD-10-CM | POA: Diagnosis not present

## 2023-03-21 DIAGNOSIS — Z1211 Encounter for screening for malignant neoplasm of colon: Secondary | ICD-10-CM | POA: Diagnosis not present

## 2023-03-21 DIAGNOSIS — R109 Unspecified abdominal pain: Secondary | ICD-10-CM | POA: Diagnosis not present

## 2023-03-21 DIAGNOSIS — R197 Diarrhea, unspecified: Secondary | ICD-10-CM | POA: Diagnosis not present

## 2023-03-21 DIAGNOSIS — Z83719 Family history of colon polyps, unspecified: Secondary | ICD-10-CM | POA: Diagnosis not present

## 2023-03-30 DIAGNOSIS — M7712 Lateral epicondylitis, left elbow: Secondary | ICD-10-CM | POA: Diagnosis not present

## 2023-03-30 DIAGNOSIS — M7711 Lateral epicondylitis, right elbow: Secondary | ICD-10-CM | POA: Diagnosis not present

## 2023-03-30 DIAGNOSIS — G5602 Carpal tunnel syndrome, left upper limb: Secondary | ICD-10-CM | POA: Diagnosis not present

## 2023-04-11 DIAGNOSIS — Z1211 Encounter for screening for malignant neoplasm of colon: Secondary | ICD-10-CM | POA: Diagnosis not present

## 2023-04-11 DIAGNOSIS — D125 Benign neoplasm of sigmoid colon: Secondary | ICD-10-CM | POA: Diagnosis not present

## 2023-04-11 DIAGNOSIS — K5730 Diverticulosis of large intestine without perforation or abscess without bleeding: Secondary | ICD-10-CM | POA: Diagnosis not present

## 2023-04-11 DIAGNOSIS — D124 Benign neoplasm of descending colon: Secondary | ICD-10-CM | POA: Diagnosis not present

## 2023-04-11 DIAGNOSIS — D123 Benign neoplasm of transverse colon: Secondary | ICD-10-CM | POA: Diagnosis not present

## 2023-04-24 DIAGNOSIS — Z1331 Encounter for screening for depression: Secondary | ICD-10-CM | POA: Diagnosis not present

## 2023-04-24 DIAGNOSIS — E559 Vitamin D deficiency, unspecified: Secondary | ICD-10-CM | POA: Diagnosis not present

## 2023-04-24 DIAGNOSIS — Z6827 Body mass index (BMI) 27.0-27.9, adult: Secondary | ICD-10-CM | POA: Diagnosis not present

## 2023-04-24 DIAGNOSIS — E782 Mixed hyperlipidemia: Secondary | ICD-10-CM | POA: Diagnosis not present

## 2023-04-24 DIAGNOSIS — G5603 Carpal tunnel syndrome, bilateral upper limbs: Secondary | ICD-10-CM | POA: Diagnosis not present

## 2023-04-24 DIAGNOSIS — M25511 Pain in right shoulder: Secondary | ICD-10-CM | POA: Diagnosis not present

## 2023-04-24 DIAGNOSIS — E1165 Type 2 diabetes mellitus with hyperglycemia: Secondary | ICD-10-CM | POA: Diagnosis not present

## 2023-04-24 DIAGNOSIS — Z0001 Encounter for general adult medical examination with abnormal findings: Secondary | ICD-10-CM | POA: Diagnosis not present

## 2023-04-24 DIAGNOSIS — Z8269 Family history of other diseases of the musculoskeletal system and connective tissue: Secondary | ICD-10-CM | POA: Diagnosis not present

## 2023-04-24 DIAGNOSIS — M7711 Lateral epicondylitis, right elbow: Secondary | ICD-10-CM | POA: Diagnosis not present
